# Patient Record
Sex: Female | Born: 1980 | Race: White | Hispanic: No | Marital: Single | Smoking: Never smoker
Health system: Southern US, Community
[De-identification: ages and names within clinical notes are randomized; demographics above are authoritative.]

## PROBLEM LIST (undated history)

## (undated) HISTORY — PX: NO PAST SURGERIES: SHX2092

---

## 2012-10-10 ENCOUNTER — Ambulatory Visit: Payer: Self-pay | Admitting: Family Medicine

## 2014-03-19 ENCOUNTER — Ambulatory Visit: Payer: Self-pay | Admitting: Medical

## 2014-03-19 LAB — RAPID STREP-A WITH REFLX: MICRO TEXT REPORT: NEGATIVE

## 2014-03-22 LAB — BETA STREP CULTURE(ARMC)

## 2016-07-18 ENCOUNTER — Encounter: Payer: Self-pay | Admitting: *Deleted

## 2016-07-18 ENCOUNTER — Ambulatory Visit
Admission: EM | Admit: 2016-07-18 | Discharge: 2016-07-18 | Disposition: A | Discharge status: Home / Self Care | Payer: Self-pay | Attending: Family Medicine | Admitting: Family Medicine

## 2016-07-18 DIAGNOSIS — J029 Acute pharyngitis, unspecified: Secondary | ICD-10-CM

## 2016-07-18 LAB — RAPID STREP SCREEN (MED CTR MEBANE ONLY): Streptococcus, Group A Screen (Direct): NEGATIVE

## 2016-07-18 MED ORDER — AMOXICILLIN 875 MG PO TABS: 875.0000 mg | ORAL_TABLET | Freq: Two times a day (BID) | ORAL | 0 refills | Status: DC

## 2016-07-18 MED ORDER — OSELTAMIVIR PHOSPHATE 75 MG PO CAPS: 75.0000 mg | ORAL_CAPSULE | Freq: Two times a day (BID) | ORAL | 0 refills | Status: DC

## 2016-07-18 NOTE — ED Triage Notes (Signed)
Sore throat, fever, chills, body aches, since yesterday.

## 2016-07-18 NOTE — ED Provider Notes (Signed)
[facility] ____________________________________________  Time seen: Approximately 5:43 PM  I have reviewed the triage vital signs and the nursing notes.   HISTORY  Chief Complaint Sore Throat; Fever; Chills; and Generalized Body Aches   HPI Denise Thompson is a 36 y.o. female  pain for the complaints of sore throat, chills, body aches and fever that started this morning and gradually worsened as the day progressed. States moderate sore throat, stating that it hurts to swallow but able to. Reports low-grade fever this afternoon. Reports multiple sick contacts at work around her. Unsure if strep or influenza contacts. Reports has continued to drink fluids today, decreased appetite. Reports has continued to remain active. Denies cough, nasal congestion or runny nose. Denies other complaints.  Denies chest pain, shortness of breath, abdominal pain, dysuria, extremity pain, extremity swelling or rash. Denies recent sickness. Denies recent antibiotic use.   Patient's last menstrual period was 07/04/2016 (approximate). Denies pregnancy.   History reviewed. No pertinent past medical history.  denies chronic medical issues.  There are no active problems to display for this patient.   History reviewed. No pertinent surgical history.   No current facility-administered medications for this encounter.   Current Outpatient Prescriptions:  .  amoxicillin (AMOXIL) 875 MG tablet, Take 1 tablet (875 mg total) by mouth 2 (two) times daily., Disp: 20 tablet, Rfl: 0 .  oseltamivir (TAMIFLU) 75 MG capsule, Take 1 capsule (75 mg total) by mouth every 12 (twelve) hours., Disp: 10 capsule, Rfl: 0  Allergies Codeine  History reviewed. No pertinent family history.  Social History Social History  Substance Use Topics  . Smoking status: Never Smoker  . Smokeless tobacco: Never Used  . Alcohol use Yes    Review of Systems Constitutional: As above.  Eyes: No visual changes. ENT:   positive sore throat. Cardiovascular: Denies chest pain. Respiratory: Denies shortness of breath. Gastrointestinal: No abdominal pain.  No nausea, no vomiting.  No diarrhea.  No constipation. Genitourinary: Negative for dysuria. Musculoskeletal: Negative for back pain. Skin: Negative for rash. Neurological: Negative for headaches, focal weakness or numbness.  10-point ROS otherwise negative.  ____________________________________________   PHYSICAL EXAM:  VITAL SIGNS: ED Triage Vitals  Enc Vitals Group     BP 07/18/16 1642 135/82     Pulse Rate 07/18/16 1642 92     Resp 07/18/16 1642 16     Temp 07/18/16 1642 99.2 F (37.3 C)     Temp Source 07/18/16 1642 Oral     SpO2 07/18/16 1642 100 %     Weight --      Height --      Head Circumference --      Peak Flow --      Pain Score 07/18/16 1717 0     Pain Loc --      Pain Edu? --      Excl. in [place]? --     Constitutional: Alert and oriented. Well appearing and in no acute distress. Eyes: Conjunctivae are normal. PERRL. EOMI. Head: Atraumatic. No sinus tenderness to palpation. No swelling. No erythema.  Ears: no erythema, normal TMs bilaterally.   Nose: No nasal congestion or rhinorrhea.  Mouth/Throat: Mucous membranes are moist. Moderate pharyngeal erythema. Mild bilateral tonsillar swelling. No exudate. Neck: No stridor.  No cervical spine tenderness to palpation. Hematological/Lymphatic/Immunilogical: Mild anterior bilateral cervical lymphadenopathy. Cardiovascular: Normal rate, regular rhythm. Grossly normal heart sounds.  Good peripheral circulation. Respiratory: Normal respiratory effort.  No retractions. No wheezes, rales or rhonchi.  Good air movement.  Gastrointestinal: Soft and nontender. No CVA tenderness. Musculoskeletal: Ambulatory with steady gait. No cervical, thoracic or lumbar tenderness to palpation. Neurologic:  Normal speech and language. No gait instability. Skin:  Skin appears warm, dry and intact. No  rash noted. Psychiatric: Mood and affect are normal. Speech and behavior are normal. ___________________________________________   LABS (all labs ordered are listed, but only abnormal results are displayed)  Labs Reviewed  RAPID STREP SCREEN (NOT AT [facility])  CULTURE, GROUP A STREP [facility])   PROCEDURES Procedures    INITIAL IMPRESSION / ASSESSMENT AND PLAN / ED COURSE  Pertinent labs & imaging results that were available during my care of the patient were reviewed by me and considered in my medical decision making (see chart for details).   well-appearing patient. No acute distress. Presents for the complaints of sore throat, chills, body aches and reported low-grade fevers. Denies cough or congestion. Discussed with patient concern for streptococcal pharyngitis. Quick strep negative, will culture. Patient expressed also concern of influenza. Discussed evaluation and treatment options with patient. As concern for streptococcal pharyngitis, will initiate oral amoxicillin., And will culture strep swab. Patient expressed concern of influenza if she were to go home and start with cough and congestion, Rx for Tamiflu sent and discussed timeframe parameters, patient states will only take if she begins to have cough and congestion in the next day. Discussed indication, risks and benefits of medications with patient.work note given for today and tomorrow.    Discussed follow up with Primary care physician this week. Discussed follow up and return parameters including no resolution or any worsening concerns. Patient verbalized understanding and agreed to plan.    ____________________________________________   FINAL CLINICAL IMPRESSION(S) / ED DIAGNOSES  Final diagnoses:  Pharyngitis, unspecified etiology     Discharge Medication List as of 07/18/2016  5:13 PM    START taking these medications   Details  amoxicillin (AMOXIL) 875 MG tablet Take 1 tablet (875 mg total) by mouth 2 (two) times  daily., Starting Wed 07/18/2016, Normal    oseltamivir (TAMIFLU) 75 MG capsule Take 1 capsule (75 mg total) by mouth every 12 (twelve) hours., Starting Wed 07/18/2016, Normal        Note: This dictation was prepared with Dragon dictation along with smaller phrase technology. Any transcriptional errors that result from this process are unintentional.         Marylene Land, NP 07/18/16 1757

## 2016-07-18 NOTE — Discharge Instructions (Signed)
Take medication as prescribed. Rest. Drink plenty of fluids.  ° °Follow up with your primary care physician this week as needed. Return to Urgent care for new or worsening concerns.  ° °

## 2016-07-21 LAB — CULTURE, GROUP A STREP (THRC)

## 2018-08-13 ENCOUNTER — Ambulatory Visit
Admission: EM | Admit: 2018-08-13 | Discharge: 2018-08-13 | Disposition: A | Discharge status: Home / Self Care | Payer: Self-pay | Attending: Family Medicine | Admitting: Family Medicine

## 2018-08-13 ENCOUNTER — Encounter: Payer: Self-pay | Admitting: Emergency Medicine

## 2018-08-13 ENCOUNTER — Other Ambulatory Visit: Payer: Self-pay

## 2018-08-13 DIAGNOSIS — R079 Chest pain, unspecified: Secondary | ICD-10-CM

## 2018-08-13 DIAGNOSIS — J302 Other seasonal allergic rhinitis: Secondary | ICD-10-CM

## 2018-08-13 DIAGNOSIS — R0789 Other chest pain: Secondary | ICD-10-CM

## 2018-08-13 DIAGNOSIS — R0602 Shortness of breath: Secondary | ICD-10-CM

## 2018-08-13 NOTE — ED Provider Notes (Signed)
[facility]    CSN: [id number] Arrival date & time: 08/13/18  1452     History   Chief Complaint Chief Complaint  Patient presents with  . Chest Pain  . Shortness of Breath    HPI Denise Thompson is a 38 y.o. female.   38 yo female with a c/o left sided chest pain and shortness of breath for 2 days. States chest pain is worse with movement and taking deep breaths. States she's slightly anxious. She also has seasonal allergies. Denies pain radiating, neck pain, jaw pain, left arm pain, injuries, fevers, chills, cough, travel. States she's slightly anxious due to "everything going around with the coronavirus".   The history is provided by the patient.  Chest Pain  Pain location:  L chest Pain quality: aching   Pain severity:  Mild Timing:  Constant Associated symptoms: shortness of breath   Shortness of Breath  Associated symptoms: chest pain     History reviewed. No pertinent past medical history.  There are no active problems to display for this patient.   Past Surgical History:  Procedure Laterality Date  . NO PAST SURGERIES      OB History   No obstetric history on file.      Home Medications    Prior to Admission medications   Medication Sig Start Date End Date Taking? Authorizing Provider  amoxicillin (AMOXIL) 875 MG tablet Take 1 tablet (875 mg total) by mouth 2 (two) times daily. 07/18/16   Marylene Land, NP  oseltamivir (TAMIFLU) 75 MG capsule Take 1 capsule (75 mg total) by mouth every 12 (twelve) hours. 07/18/16   Marylene Land, NP    Family History Family History  Problem Relation Age of Onset  . Hypertension Mother   . Healthy Father     Social History Social History   Tobacco Use  . Smoking status: Never Smoker  . Smokeless tobacco: Never Used  Substance Use Topics  . Alcohol use: Never    Frequency: Never  . Drug use: Never     Allergies   Codeine   Review of Systems Review of Systems  Respiratory:  Positive for shortness of breath.   Cardiovascular: Positive for chest pain.     Physical Exam Triage Vital Signs ED Triage Vitals  Enc Vitals Group     BP 08/13/18 1507 132/72     Pulse Rate 08/13/18 1507 91     Resp 08/13/18 1507 16     Temp 08/13/18 1507 98.4 F (36.9 C)     Temp Source 08/13/18 1507 Oral     SpO2 08/13/18 1507 100 %     Weight 08/13/18 1508 160 lb (72.6 kg)     Height 08/13/18 1508 6' (1.829 m)     Head Circumference --      Peak Flow --      Pain Score 08/13/18 1507 4     Pain Loc --      Pain Edu? --      Excl. in [place]? --    No data found.  Updated Vital Signs BP 132/72 (BP Location: Right Arm)   Pulse 91   Temp 98.4 F (36.9 C) (Oral)   Resp 16   Ht 6' (1.829 m)   Wt 72.6 kg   LMP 08/06/2018 (Approximate)   SpO2 100%   BMI 21.70 kg/m   Visual Acuity Right Eye Distance:   Left Eye Distance:   Bilateral Distance:    Right Eye Near:  Left Eye Near:    Bilateral Near:     Physical Exam Vitals signs and nursing note reviewed.  Constitutional:      General: She is not in acute distress.    Appearance: She is not toxic-appearing or diaphoretic.  Cardiovascular:     Rate and Rhythm: Normal rate and regular rhythm.     Pulses: Normal pulses.     Heart sounds: Normal heart sounds.  Pulmonary:     Effort: Pulmonary effort is normal. No respiratory distress.     Breath sounds: Normal breath sounds. No stridor. No wheezing, rhonchi or rales.  Neurological:     Mental Status: She is alert.      UC Treatments / Results  Labs (all labs ordered are listed, but only abnormal results are displayed) Labs Reviewed - No data to display  EKG None  Radiology No results found.  Procedures ED EKG Date/Time: 08/13/2018 9:00 PM Performed by: Norval Gable, MD Authorized by: Coral Spikes, DO   ECG reviewed by ED Physician in the absence of a cardiologist: yes   Previous ECG:    Previous ECG:  Unavailable Interpretation:     Interpretation: normal   Rate:    ECG rate:  84   ECG rate assessment: normal   Rhythm:    Rhythm: sinus rhythm   Ectopy:    Ectopy: none   QRS:    QRS axis:  Normal Conduction:    Conduction: normal   ST segments:    ST segments:  Normal T waves:    T waves: normal     (including critical care time)  Medications Ordered in UC Medications - No data to display  Initial Impression / Assessment and Plan / UC Course  I have reviewed the triage vital signs and the nursing notes.  Pertinent labs & imaging results that were available during my care of the patient were reviewed by me and considered in my medical decision making (see chart for details).      Final Clinical Impressions(s) / UC Diagnoses   Final diagnoses:  Atypical chest pain  Seasonal allergies     Discharge Instructions     Over the counter zyrtec, benadryl and flonase    ED Prescriptions    None     1. diagnosis reviewed with patient 2. Recommend supportive treatment as above    3. Follow-up prn if symptoms worsen or don't improve   Controlled Substance Prescriptions [place] Controlled Substance Registry consulted? Not Applicable   Norval Gable, MD 08/13/18 2106

## 2018-08-13 NOTE — Discharge Instructions (Addendum)
Over the counter zyrtec, benadryl and flonase

## 2018-08-13 NOTE — ED Triage Notes (Addendum)
Patient in today c/o CP and SOB x 2 days. Patient describes CP as pressure. Patient denies nausea, emesis, jaw pain, shoulder pain or back pain. Patient denies cardiac history.

## 2019-06-11 ENCOUNTER — Ambulatory Visit
Admission: EM | Admit: 2019-06-11 | Discharge: 2019-06-11 | Disposition: A | Discharge status: Home / Self Care | Payer: Self-pay | Attending: Family Medicine | Admitting: Family Medicine

## 2019-06-11 ENCOUNTER — Other Ambulatory Visit: Payer: Self-pay

## 2019-06-11 DIAGNOSIS — F411 Generalized anxiety disorder: Secondary | ICD-10-CM

## 2019-06-11 NOTE — Discharge Instructions (Signed)
Rest.  No need to worry.  See attached info regarding managing anxiety.  Take care  Dr. Lacinda Axon

## 2019-06-11 NOTE — ED Provider Notes (Signed)
[facility]    CSN: [id number] Arrival date & time: 06/11/19  [id number]   History   Chief Complaint Chief Complaint  Patient presents with  . Shortness of Breath    HPI 39 year old female presents with SOB.  Patient reports that over the past 4 days she has felt short of breath.  She has difficulty describing what she means when she reports shortness of breath.  However, she states that she has periods of time where she seems to breathe rapidly.  She was recently tested for Covid and her result was negative on 1/6.  Patient has no cough.  No respiratory symptoms.  No fever.  She has no other complaints or associated symptoms.  No known exacerbating or relieving factors.  No other complaints.  PMH, Surgical Hx, Family Hx, Social History reviewed and updated as below.  PMH: No significant PMH.  Past Surgical History:  Procedure Laterality Date  . NO PAST SURGERIES     OB History   No obstetric history on file.     Home Medications    Prior to Admission medications   Not on File    Family History Family History  Problem Relation Age of Onset  . Hypertension Mother   . Healthy Father     Social History Social History   Tobacco Use  . Smoking status: Never Smoker  . Smokeless tobacco: Never Used  Substance Use Topics  . Alcohol use: Never  . Drug use: Never     Allergies   Codeine   Review of Systems Review of Systems  Constitutional: Negative.   HENT: Negative.   Respiratory: Positive for shortness of breath.    Physical Exam Triage Vital Signs ED Triage Vitals  Enc Vitals Group     BP 06/11/19 0943 (!) 143/68     Pulse Rate 06/11/19 0943 97     Resp 06/11/19 0943 18     Temp 06/11/19 0943 99.1 F (37.3 C)     Temp Source 06/11/19 0943 Oral     SpO2 06/11/19 0943 100 %     Weight 06/11/19 0940 150 lb (68 kg)     Height 06/11/19 0940 6' (1.829 m)     Head Circumference --      Peak Flow --      Pain Score 06/11/19 0940 5     Pain  Loc --      Pain Edu? --      Excl. in [place]? --    Updated Vital Signs BP (!) 143/68 (BP Location: Left Arm)   Pulse 97   Temp 99.1 F (37.3 C) (Oral)   Resp 18   Ht 6' (1.829 m)   Wt 68 kg   LMP 06/04/2019   SpO2 100%   BMI 20.34 kg/m   Visual Acuity Right Eye Distance:   Left Eye Distance:   Bilateral Distance:    Right Eye Near:   Left Eye Near:    Bilateral Near:     Physical Exam Vitals and nursing note reviewed.  Constitutional:      General: She is not in acute distress.    Appearance: Normal appearance. She is not ill-appearing.  HENT:     Head: Normocephalic and atraumatic.  Eyes:     General:        Right eye: No discharge.        Left eye: No discharge.     Conjunctiva/sclera: Conjunctivae normal.  Cardiovascular:  Rate and Rhythm: Normal rate and regular rhythm.     Heart sounds: No murmur.  Pulmonary:     Effort: Pulmonary effort is normal.     Breath sounds: Normal breath sounds. No wheezing, rhonchi or rales.  Neurological:     Mental Status: She is alert.     Comments: Stutter noted.  Psychiatric:     Comments: Flat affect. Depressed mood.     UC Treatments / Results  Labs (all labs ordered are listed, but only abnormal results are displayed) Labs Reviewed - No data to display  EKG   Radiology No results found.  Procedures Procedures (including critical care time)  Medications Ordered in UC Medications - No data to display  Initial Impression / Assessment and Plan / UC Course  I have reviewed the triage vital signs and the nursing notes.  Pertinent labs & imaging results that were available during my care of the patient were reviewed by me and considered in my medical decision making (see chart for details).    39 year old female presents with reported shortness of breath.  She is well-appearing.  She is not tachypneic or hypoxic.  Lungs clear.  Patient seems anxious.  I suspect that this is all secondary to anxiety.   Information given.  Supportive care.  Final Clinical Impressions(s) / UC Diagnoses   Final diagnoses:  Anxiety state     Discharge Instructions     Rest.  No need to worry.  See attached info regarding managing anxiety.  Take care  Dr. Lacinda Axon    ED Prescriptions    None     PDMP not reviewed this encounter.   Coral Spikes, [place] 06/11/19 1036

## 2019-06-11 NOTE — ED Triage Notes (Signed)
Patient states that she is here for shortness of breath that started 4 days ago. States that she was swabbed for Covid on 06/10/2018 and was negative, feels like she cannot take a deep breath.

## 2019-06-30 ENCOUNTER — Other Ambulatory Visit: Payer: Self-pay

## 2019-06-30 ENCOUNTER — Encounter: Payer: Self-pay | Admitting: Emergency Medicine

## 2019-06-30 ENCOUNTER — Ambulatory Visit
Admission: EM | Admit: 2019-06-30 | Discharge: 2019-06-30 | Disposition: A | Discharge status: Home / Self Care | Payer: Self-pay | Attending: Family Medicine | Admitting: Family Medicine

## 2019-06-30 DIAGNOSIS — R631 Polydipsia: Secondary | ICD-10-CM

## 2019-06-30 DIAGNOSIS — R5383 Other fatigue: Secondary | ICD-10-CM

## 2019-06-30 DIAGNOSIS — R3589 Other polyuria: Secondary | ICD-10-CM

## 2019-06-30 DIAGNOSIS — R519 Headache, unspecified: Secondary | ICD-10-CM

## 2019-06-30 DIAGNOSIS — R531 Weakness: Secondary | ICD-10-CM

## 2019-06-30 DIAGNOSIS — E232 Diabetes insipidus: Secondary | ICD-10-CM

## 2019-06-30 DIAGNOSIS — R350 Frequency of micturition: Secondary | ICD-10-CM

## 2019-06-30 DIAGNOSIS — R002 Palpitations: Secondary | ICD-10-CM

## 2019-06-30 LAB — POCT URINALYSIS DIP (MANUAL ENTRY)
Bilirubin, UA: NEGATIVE
Glucose, UA: NEGATIVE mg/dL
Ketones, POC UA: NEGATIVE mg/dL
Leukocytes, UA: NEGATIVE
Nitrite, UA: NEGATIVE
Protein Ur, POC: 30 mg/dL — AB
Spec Grav, UA: 1.015 (ref 1.010–1.025)
Urobilinogen, UA: 0.2 E.U./dL
pH, UA: 7 (ref 5.0–8.0)

## 2019-06-30 LAB — POCT FASTING CBG KUC MANUAL ENTRY: POCT Glucose (KUC): 114 mg/dL — AB (ref 70–99)

## 2019-06-30 NOTE — ED Provider Notes (Signed)
[facility]    CSN: [id number] Arrival date & time: 06/30/19  1524      History   Chief Complaint Chief Complaint  Patient presents with  . Headache  . Weakness    HPI Denise Thompson is a 39 y.o. female.   Reports that she has been experiencing intermittent symptoms of shakiness, headaches, shortness of breath, heart palpitations, increased thirst, urinary frequency since 06/10/19. Denies cough, fever, chills, body aches, n/v/d, rash. Reports that she has previously been seen for these symptoms and was told she possibly has anxiety. Reports that she does not feel anxious, or tense, or upset. Reports that she has not had labs done in years, and that she has not had thyroid checked or been checked for diabetes anywhere.   The history is provided by the patient.    History reviewed. No pertinent past medical history.  There are no problems to display for this patient.   Past Surgical History:  Procedure Laterality Date  . NO PAST SURGERIES      OB History   No obstetric history on file.      Home Medications    Prior to Admission medications   Not on File    Family History Family History  Problem Relation Age of Onset  . Hypertension Mother   . Healthy Father     Social History Social History   Tobacco Use  . Smoking status: Never Smoker  . Smokeless tobacco: Never Used  Substance Use Topics  . Alcohol use: Never  . Drug use: Never     Allergies   Codeine   Review of Systems Review of Systems  Constitutional: Positive for fatigue. Negative for chills and fever.  HENT: Negative for ear pain, postnasal drip, rhinorrhea, sinus pressure, sinus pain and sore throat.   Eyes: Negative for pain and visual disturbance.  Respiratory: Negative for cough and shortness of breath.   Cardiovascular: Negative for chest pain and palpitations.  Gastrointestinal: Negative for abdominal pain, diarrhea, nausea and vomiting.  Endocrine: Positive for  polydipsia and polyuria.  Genitourinary: Positive for frequency. Negative for dysuria and hematuria.  Musculoskeletal: Negative for arthralgias, back pain and myalgias.  Skin: Negative for color change and rash.  Neurological: Positive for headaches. Negative for seizures and syncope.  All other systems reviewed and are negative.    Physical Exam Triage Vital Signs ED Triage Vitals  Enc Vitals Group     BP      Pulse      Resp      Temp      Temp src      SpO2      Weight      Height      Head Circumference      Peak Flow      Pain Score      Pain Loc      Pain Edu?      Excl. in [place]?    No data found.  Updated Vital Signs BP 117/80 (BP Location: Left Arm)   Pulse 82   Temp 98.9 F (37.2 C) (Oral)   Resp 16   Ht 6' (1.829 m)   Wt 150 lb (68 kg)   LMP 06/27/2019 (Exact Date)   SpO2 98%   BMI 20.34 kg/m     Physical Exam Vitals and nursing note reviewed.  Constitutional:      General: She is not in acute distress.    Appearance: She is well-developed.  HENT:     Head: Normocephalic and atraumatic.     Right Ear: Tympanic membrane normal.     Left Ear: Tympanic membrane normal.     Mouth/Throat:     Mouth: Mucous membranes are moist.     Pharynx: No oropharyngeal exudate or posterior oropharyngeal erythema.  Eyes:     Conjunctiva/sclera: Conjunctivae normal.  Cardiovascular:     Rate and Rhythm: Normal rate. Rhythm irregular.     Heart sounds: No murmur.  Pulmonary:     Effort: Pulmonary effort is normal. No respiratory distress.     Breath sounds: Normal breath sounds.  Abdominal:     General: Abdomen is flat.     Palpations: Abdomen is soft.     Tenderness: There is no abdominal tenderness.  Musculoskeletal:        General: Normal range of motion.     Cervical back: Neck supple.  Skin:    General: Skin is warm and dry.  Neurological:     General: No focal deficit present.     Mental Status: She is alert and oriented to person, place, and time.   Psychiatric:        Mood and Affect: Mood normal.        Behavior: Behavior normal.      UC Treatments / Results  Labs (all labs ordered are listed, but only abnormal results are displayed) Labs Reviewed  POCT FASTING CBG [facility] MANUAL ENTRY - Abnormal; Notable for the following components:      Result Value   POCT Glucose ([facility]) 114 (*)    All other components within normal limits  POCT URINALYSIS DIP (MANUAL ENTRY) - Abnormal; Notable for the following components:   Clarity, UA hazy (*)    Blood, UA large (*)    Protein Ur, POC =30 (*)    All other components within normal limits    EKG   Radiology No results found.  Procedures Procedures (including critical care time)  Medications Ordered in UC Medications - No data to display  Initial Impression / Assessment and Plan / UC Course  I have reviewed the triage vital signs and the nursing notes.  Pertinent labs & imaging results that were available during my care of the patient were reviewed by me and considered in my medical decision making (see chart for details).  Clinical Course as of Jun 30 1615  Tue Jun 30, 2019  1603 POCT urinalysis dipstick [SM]    Clinical Course User Index [SM] Faustino Congress, NP   Presents today with ongoing intermittent symptoms of headache, shakiness, polydipsia, polyuria, heart palpitations. EKG in office today, HR 76, no ST elevation noted, compared to EKG from 08/2018, no marked changes. Instructed that she needs to find primary care provider and needs a physical with labs. Instructed on when to report to the Emergency Room.  Final Clinical Impressions(s) / UC Diagnoses   Final diagnoses:  Fatigue, unspecified type  Primary polydipsia  Frequency of urination and polyuria  Weakness  Palpitations     Discharge Instructions     You really need to establish a primary care provider. I have attached information for a provider near you.   Report to the emergency room if you have  shortness of breath, severe palpitations, lose consciousness, or other serious concerns.     ED Prescriptions    None     PDMP not reviewed this encounter.   Faustino Congress, NP 06/30/19 [phone]

## 2019-06-30 NOTE — ED Triage Notes (Signed)
Patient in today c/o headache, weakness, sob since 06/10/19. Patient was seen at [facility] in [facility] for same on 06/11/19.

## 2019-06-30 NOTE — Discharge Instructions (Signed)
You really need to establish a primary care provider. I have attached information for a provider near you.   Report to the emergency room if you have shortness of breath, severe palpitations, lose consciousness, or other serious concerns.

## 2019-07-03 ENCOUNTER — Emergency Department
Admission: EM | Admit: 2019-07-03 | Discharge: 2019-07-03 | Disposition: A | Discharge status: Home / Self Care | Payer: Self-pay | Attending: Emergency Medicine | Admitting: Emergency Medicine

## 2019-07-03 ENCOUNTER — Other Ambulatory Visit: Payer: Self-pay

## 2019-07-03 ENCOUNTER — Encounter: Payer: Self-pay | Admitting: Emergency Medicine

## 2019-07-03 ENCOUNTER — Emergency Department: Payer: Self-pay

## 2019-07-03 DIAGNOSIS — G44209 Tension-type headache, unspecified, not intractable: Secondary | ICD-10-CM | POA: Insufficient documentation

## 2019-07-03 DIAGNOSIS — Z885 Allergy status to narcotic agent status: Secondary | ICD-10-CM | POA: Insufficient documentation

## 2019-07-03 LAB — COMPREHENSIVE METABOLIC PANEL WITH GFR
ALT: 14 U/L (ref 0–44)
AST: 19 U/L (ref 15–41)
Albumin: 4.1 g/dL (ref 3.5–5.0)
Alkaline Phosphatase: 26 U/L — ABNORMAL LOW (ref 38–126)
Anion gap: 8 (ref 5–15)
BUN: 12 mg/dL (ref 6–20)
CO2: 28 mmol/L (ref 22–32)
Calcium: 9.1 mg/dL (ref 8.9–10.3)
Chloride: 103 mmol/L (ref 98–111)
Creatinine, Ser: 0.81 mg/dL (ref 0.44–1.00)
GFR calc Af Amer: >60 mL/min
GFR calc non Af Amer: >60 mL/min
Glucose, Bld: 92 mg/dL (ref 70–99)
Potassium: 4 mmol/L (ref 3.5–5.1)
Sodium: 139 mmol/L (ref 135–145)
Total Bilirubin: 0.7 mg/dL (ref 0.3–1.2)
Total Protein: 6.8 g/dL (ref 6.5–8.1)

## 2019-07-03 LAB — CBC WITH DIFFERENTIAL/PLATELET
Abs Immature Granulocytes: 0 K/uL (ref 0.00–0.07)
Basophils Absolute: 0 K/uL (ref 0.0–0.1)
Basophils Relative: 1 %
Eosinophils Absolute: 0.1 K/uL (ref 0.0–0.5)
Eosinophils Relative: 2 %
HCT: 34.2 % — ABNORMAL LOW (ref 36.0–46.0)
Hemoglobin: 11.3 g/dL — ABNORMAL LOW (ref 12.0–15.0)
Immature Granulocytes: 0 %
Lymphocytes Relative: 34 %
Lymphs Abs: 1.1 K/uL (ref 0.7–4.0)
MCH: 30.2 pg (ref 26.0–34.0)
MCHC: 33 g/dL (ref 30.0–36.0)
MCV: 91.4 fL (ref 80.0–100.0)
Monocytes Absolute: 0.2 K/uL (ref 0.1–1.0)
Monocytes Relative: 7 %
Neutro Abs: 1.8 K/uL (ref 1.7–7.7)
Neutrophils Relative %: 56 %
Platelets: 226 K/uL (ref 150–400)
RBC: 3.74 MIL/uL — ABNORMAL LOW (ref 3.87–5.11)
RDW: 12.4 % (ref 11.5–15.5)
WBC: 3.2 K/uL — ABNORMAL LOW (ref 4.0–10.5)
nRBC: 0 % (ref 0.0–0.2)

## 2019-07-03 LAB — TSH: TSH: 1.946 u[IU]/mL (ref 0.350–4.500)

## 2019-07-03 MED ORDER — PROMETHAZINE HCL 12.5 MG PO TABS: 12.5000 mg | ORAL_TABLET | Freq: Four times a day (QID) | ORAL | 0 refills | Status: DC | PRN

## 2019-07-03 MED ORDER — PROMETHAZINE HCL 25 MG/ML IJ SOLN
12.5000 mg | Freq: Once | INTRAMUSCULAR | Status: AC
  Administered 2019-07-03: 09:00:00 12.5 mg via INTRAVENOUS
  Filled 2019-07-03: qty 1

## 2019-07-03 MED ORDER — SODIUM CHLORIDE 0.9 % IV BOLUS
1000.0000 mL | Freq: Once | INTRAVENOUS | Status: AC
  Administered 2019-07-03: 1000 mL via INTRAVENOUS

## 2019-07-03 NOTE — Discharge Instructions (Signed)
Take tylenol, excedrin for headaches   See Dr. Melrose Nakayama for follow up   Take phenergan for severe headaches   Return to ER if you have worse headaches, trouble speaking

## 2019-07-03 NOTE — ED Provider Notes (Signed)
[facility] EMERGENCY DEPARTMENT Provider Note   CSN: [id number] Arrival date & time: 07/03/19  [id number]     History Chief Complaint  Patient presents with  . Dizziness  . Headache    Denise Thompson is a 39 y.o. female otherwise healthy here presenting with dizziness and headaches.  Patient states that she has been having intermittent headaches for the last 3 weeks.  She has been more anxious recently.  She went to see urgent care earlier in the month and had a negative Covid test.  She was thought to have anxiety.  She was seen at urgent care recently and had a blood sugar 115 and some glucose in her urine and was still thought to have anxiety as well.  Patient states that she stutters at baseline and her stuttering has gotten slightly worse.  She is weak all over.  Denies any falls or numbness or focal weakness.  Denies any history of aneurysms.  The history is provided by the patient.       History reviewed. No pertinent past medical history.  There are no problems to display for this patient.   Past Surgical History:  Procedure Laterality Date  . NO PAST SURGERIES       OB History   No obstetric history on file.     Family History  Problem Relation Age of Onset  . Hypertension Mother   . Healthy Father     Social History   Tobacco Use  . Smoking status: Never Smoker  . Smokeless tobacco: Never Used  Substance Use Topics  . Alcohol use: Never  . Drug use: Never    Home Medications Prior to Admission medications   Not on File    Allergies    Codeine  Review of Systems   Review of Systems  Neurological: Positive for dizziness and headaches.  All other systems reviewed and are negative.   Physical Exam Updated Vital Signs BP 120/79 (BP Location: Left Arm)   Pulse 81   Temp 97.8 F (36.6 C) (Oral)   Resp 18   Ht 6' (1.829 m)   Wt 68 kg   LMP 06/27/2019 (Exact Date)   SpO2 100%   BMI 20.33 kg/m   Physical Exam Vitals  and nursing note reviewed.  Constitutional:      Comments: Anxious   HENT:     Head: Normocephalic.     Mouth/Throat:     Mouth: Mucous membranes are moist.  Eyes:     Extraocular Movements: Extraocular movements intact.  Cardiovascular:     Rate and Rhythm: Normal rate and regular rhythm.     Heart sounds: Normal heart sounds.  Pulmonary:     Effort: Pulmonary effort is normal.     Breath sounds: Normal breath sounds.  Abdominal:     General: Bowel sounds are normal.     Palpations: Abdomen is soft.  Musculoskeletal:        General: Normal range of motion.     Cervical back: Normal range of motion and neck supple.  Skin:    General: Skin is warm.  Neurological:     Mental Status: She is alert and oriented to person, place, and time.     Comments: Some stuttering speech but no slurred speech. CN 2- 12 intact, nl strength throughout, nl sensation throughout. Nl gait   Psychiatric:        Mood and Affect: Mood normal.     ED Results / Procedures /  Treatments   Labs (all labs ordered are listed, but only abnormal results are displayed) Labs Reviewed  CBC WITH DIFFERENTIAL/PLATELET - Abnormal; Notable for the following components:      Result Value   WBC 3.2 (*)    RBC 3.74 (*)    Hemoglobin 11.3 (*)    HCT 34.2 (*)    All other components within normal limits  COMPREHENSIVE METABOLIC PANEL  URINALYSIS, COMPLETE (UACMP) WITH MICROSCOPIC  TSH  POC URINE PREG, ED    EKG None  Radiology CT Head Wo Contrast  Result Date: 07/03/2019 CLINICAL DATA:  Headache and dizziness for 3 weeks. EXAM: CT HEAD WITHOUT CONTRAST TECHNIQUE: Contiguous axial images were obtained from the base of the skull through the vertex without intravenous contrast. COMPARISON:  None. FINDINGS: Brain: No evidence of acute infarction, hemorrhage, hydrocephalus, extra-axial collection or mass lesion/mass effect. Vascular: No hyperdense vessel or unexpected calcification. Skull: Normal. Negative for  fracture or focal lesion. Sinuses/Orbits: Normal. Other: None. IMPRESSION: Normal head CT. Electronically Signed   By: Inge Rise M.D.   On: 07/03/2019 09:05    Procedures Procedures (including critical care time)  Medications Ordered in ED Medications  sodium chloride 0.9 % bolus 1,000 mL (1,000 mLs Intravenous New Bag/Given 07/03/19 0922)  promethazine (PHENERGAN) injection 12.5 mg (12.5 mg Intravenous Given 07/03/19 [id number])    ED Course  I have reviewed the triage vital signs and the nursing notes.  Pertinent labs & imaging results that were available during my care of the patient were reviewed by me and considered in my medical decision making (see chart for details).    MDM Rules/Calculators/A&P                     Denise Thompson is a 39 y.o. female here with headache and dizziness.  Symptoms for the last 3 weeks or so.  Patient appears anxious and I suspect some tension headaches or anxiety.  Given that the headache has persisted we will get a CT head to rule out bleed or mass.   11:32 AM Labs and CT head unremarkable. I think likely tension headaches. Will have her continue tylenol, excedrin, will give phenergan prn. Will have her see neurology outpatient   Final Clinical Impression(s) / ED Diagnoses Final diagnoses:  None    Rx / DC Orders ED Discharge Orders    None       Drenda Freeze, MD 07/03/19 1132

## 2019-07-03 NOTE — ED Triage Notes (Signed)
C/O headache and dizziness x 3 weeks.  Seen through urgent care this morning, and referred to ED for further evaluation.  Patient is AAOx3.  Skin warm and dry. NAD

## 2019-07-03 NOTE — ED Notes (Signed)
pt up to toilet

## 2019-07-03 NOTE — ED Notes (Signed)
Pt transported to CT ?

## 2019-08-07 ENCOUNTER — Ambulatory Visit: Payer: Self-pay

## 2019-08-08 ENCOUNTER — Other Ambulatory Visit: Payer: Self-pay

## 2019-08-08 ENCOUNTER — Ambulatory Visit: Payer: Self-pay | Attending: Internal Medicine

## 2019-08-08 DIAGNOSIS — Z23 Encounter for immunization: Secondary | ICD-10-CM

## 2019-08-08 NOTE — Progress Notes (Signed)
   Covid-19 Vaccination Clinic  Name:  Denise Thompson    MRN: [id number] DOB: Oct 08, 1980  08/08/2019  [name] was observed post Covid-19 immunization for 15 minutes without incident. She was provided with Vaccine Information Sheet and instruction to access the V-Safe system.   [name] was instructed to call 911 with any severe reactions post vaccine: Marland Kitchen Difficulty breathing  . Swelling of face and throat  . A fast heartbeat  . A bad rash all over body  . Dizziness and weakness   Immunizations Administered    Name Date Dose VIS Date Route   Moderna COVID-19 Vaccine 08/08/2019  3:49 PM 0.5 mL 05/05/2019 Intramuscular   Manufacturer: Moderna   Lot: [id number]   [id number]

## 2019-08-12 ENCOUNTER — Ambulatory Visit: Payer: Self-pay | Admitting: Family Medicine

## 2019-08-13 ENCOUNTER — Ambulatory Visit: Payer: Self-pay

## 2019-08-14 ENCOUNTER — Ambulatory Visit: Payer: Self-pay

## 2019-09-05 ENCOUNTER — Ambulatory Visit: Payer: Medicaid Other | Attending: Internal Medicine

## 2019-09-05 DIAGNOSIS — Z23 Encounter for immunization: Secondary | ICD-10-CM

## 2019-09-05 NOTE — Progress Notes (Signed)
   Covid-19 Vaccination Clinic  Name:  Minakshi Nordine    MRN: [id number] DOB: 1980-11-20  09/05/2019  Ms. Hocevar was observed post Covid-19 immunization for 15 minutes without incident. She was provided with Vaccine Information Sheet and instruction to access the V-Safe system.   Ms. Soles was instructed to call 911 with any severe reactions post vaccine: Marland Kitchen Difficulty breathing  . Swelling of face and throat  . A fast heartbeat  . A bad rash all over body  . Dizziness and weakness   Immunizations Administered    Name Date Dose VIS Date Route   Moderna COVID-19 Vaccine 09/05/2019  8:56 AM 0.5 mL 05/05/2019 Intramuscular   Manufacturer: Levan Hurst   [id number]   Edith [id number]

## 2019-10-28 ENCOUNTER — Emergency Department
Admission: EM | Admit: 2019-10-28 | Discharge: 2019-10-28 | Disposition: A | Discharge status: Home / Self Care | Payer: Medicaid Other | Attending: Emergency Medicine | Admitting: Emergency Medicine

## 2019-10-28 ENCOUNTER — Emergency Department: Payer: Medicaid Other

## 2019-10-28 ENCOUNTER — Other Ambulatory Visit: Payer: Self-pay

## 2019-10-28 DIAGNOSIS — Z5321 Procedure and treatment not carried out due to patient leaving prior to being seen by health care provider: Secondary | ICD-10-CM | POA: Insufficient documentation

## 2019-10-28 DIAGNOSIS — R42 Dizziness and giddiness: Secondary | ICD-10-CM | POA: Insufficient documentation

## 2019-10-28 DIAGNOSIS — R0789 Other chest pain: Secondary | ICD-10-CM | POA: Insufficient documentation

## 2019-10-28 LAB — BASIC METABOLIC PANEL
Anion gap: 9 (ref 5–15)
BUN: 17 mg/dL (ref 6–20)
CO2: 26 mmol/L (ref 22–32)
Calcium: 9.4 mg/dL (ref 8.9–10.3)
Chloride: 102 mmol/L (ref 98–111)
Creatinine, Ser: 0.69 mg/dL (ref 0.44–1.00)
GFR calc Af Amer: >60 mL/min (ref >60)
GFR calc non Af Amer: >60 mL/min (ref >60)
Glucose, Bld: 109 mg/dL — ABNORMAL HIGH (ref 70–99)
Potassium: 4 mmol/L (ref 3.5–5.1)
Sodium: 137 mmol/L (ref 135–145)

## 2019-10-28 LAB — CBC
HCT: 35.1 % — ABNORMAL LOW (ref 36.0–46.0)
Hemoglobin: 11.7 g/dL — ABNORMAL LOW (ref 12.0–15.0)
MCH: 31.3 pg (ref 26.0–34.0)
MCHC: 33.3 g/dL (ref 30.0–36.0)
MCV: 93.9 fL (ref 80.0–100.0)
Platelets: 243 10*3/uL (ref 150–400)
RBC: 3.74 MIL/uL — ABNORMAL LOW (ref 3.87–5.11)
RDW: 13.5 % (ref 11.5–15.5)
WBC: 5.2 10*3/uL (ref 4.0–10.5)
nRBC: 0 % (ref 0.0–0.2)

## 2019-10-28 LAB — TROPONIN I (HIGH SENSITIVITY)
Troponin I (High Sensitivity): <2 ng/L (ref <18)
Troponin I (High Sensitivity): <2 ng/L (ref <18)

## 2019-10-28 MED ORDER — SODIUM CHLORIDE 0.9% FLUSH: 3.0000 mL | Freq: Once | INTRAVENOUS | Status: DC

## 2019-10-28 NOTE — ED Triage Notes (Signed)
Pt comes via POV from work with c/o dizziness and CP. Pt states the CP has been going on over a month. Pt states today while at work she felt like she was going to pass out.  Pt states mid sternal chest pain.

## 2019-10-31 ENCOUNTER — Telehealth: Payer: Medicaid Other | Admitting: Nurse Practitioner

## 2019-10-31 DIAGNOSIS — R0602 Shortness of breath: Secondary | ICD-10-CM

## 2019-10-31 DIAGNOSIS — D649 Anemia, unspecified: Secondary | ICD-10-CM

## 2019-10-31 DIAGNOSIS — J4520 Mild intermittent asthma, uncomplicated: Secondary | ICD-10-CM

## 2019-10-31 DIAGNOSIS — R0789 Other chest pain: Secondary | ICD-10-CM

## 2019-10-31 MED ORDER — ALBUTEROL SULFATE HFA 108 (90 BASE) MCG/ACT IN AERS: 2.0000 | INHALATION_SPRAY | Freq: Four times a day (QID) | RESPIRATORY_TRACT | 0 refills | Status: DC | PRN

## 2019-10-31 NOTE — Progress Notes (Signed)
I just responded to thi previously. Yes, your hemoglobin I low which mean anemia. It I common for thei to happen during or after your menses. You can take a multi vitamin with iron and that should help.  Patient keeps ending message wanting me to respond to lab work and EKG that was done in the ED yesterday.

## 2019-10-31 NOTE — Progress Notes (Signed)
E-Visit for Corona Virus Screening  Your current symptoms could be consistent with the coronavirus.  Many health care providers can now test patients at their office but not all are.  [place] has multiple testing sites. For information on our [place] testing locations and hours go to [email or web address]  We are enrolling you in our [place] for [place] . Daily you will receive a questionnaire within the [place] website. Our COVID 19 response team will be monitoring your responses daily.  Testing Information: The COVID-19 Community Testing sites will begin testing BY APPOINTMENT ONLY.  You can schedule online at [email or web address]  If you do not have access to a smart phone or computer you may call [phone] for an appointment.   Additional testing sites in the Community:  . For [facility] Testing sites in [facility]  FaceUpdate.uy  . For Pop-up testing sites in [place]  BowlDirectory.co.uk  . For Testing sites with regular hours [email or web address]  . For [place] [email or web address]  . For [facility] [email or web address]  . For [facility] testing in [place] and [employer] [email or web address]  . For [facility] testing in [facility]   [email or web address].care/[email or web address]  For  more information about community testing call [phone]   Please quarantine yourself while awaiting your test results. Please stay home for a minimum of 10 days from the first day of illness with improving symptoms and you have had 24 hours of no fever (without the use of Tylenol (Acetaminophen)  Motrin (Ibuprofen) or any fever reducing medication).  Also - Do not get tested prior to returning to work because once you have had a positive test the test can stay positive for more then a month in some cases.   You should wear a mask or cloth face covering over your nose and mouth if you must be around other people or animals, including pets (even at home). Try to stay at least 6 feet away from other people. This will protect the people around you.  Please continue good preventive care measures, including:  frequent hand-washing, avoid touching your face, cover coughs/sneezes, stay out of crowds and keep a 6 foot distance from others.  COVID-19 is a respiratory illness with symptoms that are similar to the flu. Symptoms are typically mild to moderate, but there have been cases of severe illness and death due to the virus.   The following symptoms may appear 2-14 days after exposure: . Fever . Cough . Shortness of breath or difficulty breathing . Chills . Repeated shaking with chills . Muscle pain . Headache . Sore throat . New loss of taste or smell . Fatigue . Congestion or runny nose . Nausea or vomiting . Diarrhea  Go to the nearest hospital ED for assessment if fever/cough/breathlessness are severe or illness seems like a threat to life.  It is vitally important that if you feel that you have an infection such as this virus or any other virus that you stay home and away from places where you may spread it to others.  You should avoid contact with people age 57 and older.   You can use medication such as A prescription inhaler called Albuterol MDI 90 mcg /actuation 2 puffs every 4 hours as needed for shortness of breath, wheezing, cough  You may also take acetaminophen (Tylenol) as needed for fever.  Reduce your risk of any infection by using the same precautions used for avoiding the common cold or flu:  Marland Kitchen Wash your  hands often with soap and warm water for at least 20 seconds.  If  soap and water are not readily available, use an alcohol-based hand sanitizer with at least 60% alcohol.  . If coughing or sneezing, cover your mouth and nose by coughing or sneezing into the elbow areas of your shirt or coat, into a tissue or into your sleeve (not your hands). . Avoid shaking hands with others and consider head nods or verbal greetings only. . Avoid touching your eyes, nose, or mouth with unwashed hands.  . Avoid close contact with people who are sick. . Avoid places or events with large numbers of people in one location, like concerts or sporting events. . Carefully consider travel plans you have or are making. . If you are planning any travel outside or inside the [place], visit the CDC's Travelers' Health webpage for the latest health notices. . If you have some symptoms but not all symptoms, continue to monitor at home and seek medical attention if your symptoms worsen. . If you are having a medical emergency, call 911.  HOME CARE . Only take medications as instructed by your medical team. . Drink plenty of fluids and get plenty of rest. . A steam or ultrasonic humidifier can help if you have congestion.   GET HELP RIGHT AWAY IF YOU HAVE EMERGENCY WARNING SIGNS** FOR COVID-19. If you or someone is showing any of these signs seek emergency medical care immediately. Call 911 or proceed to your closest emergency facility if: . You develop worsening high fever. . Trouble breathing . Bluish lips or face . Persistent pain or pressure in the chest . New confusion . Inability to wake or stay awake . You cough up blood. . Your symptoms become more severe  **This list is not all possible symptoms. Contact your medical provider for any symptoms that are sever or concerning to you.  MAKE SURE YOU   Understand these instructions.  Will watch your condition.  Will get help right away if you are not doing well or get worse.  Your e-visit answers were reviewed by a board  certified advanced clinical practitioner to complete your personal care plan.  Depending on the condition, your plan could have included both over the counter or prescription medications.  If there is a problem please reply once you have received a response from your provider.  Your safety is important to [place].  If you have drug allergies check your prescription carefully.    You can use MyChart to ask questions about today's visit, request a non-urgent call back, or ask for a work or school excuse for 24 hours related to this e-Visit. If it has been greater than 24 hours you will need to follow up with your provider, or enter a new e-Visit to address those concerns. You will get an e-mail in the next two days asking about your experience.  I hope that your e-visit has been valuable and will speed your recovery. Thank you for using e-visits.   5-10 minutes spent reviewing and documenting in chart.

## 2019-11-20 ENCOUNTER — Ambulatory Visit
Admission: EM | Admit: 2019-11-20 | Discharge: 2019-11-20 | Disposition: A | Discharge status: Home / Self Care | Payer: Medicaid Other

## 2019-11-20 ENCOUNTER — Encounter: Payer: Self-pay | Admitting: Emergency Medicine

## 2019-11-20 ENCOUNTER — Other Ambulatory Visit: Payer: Self-pay

## 2019-11-20 DIAGNOSIS — M79602 Pain in left arm: Secondary | ICD-10-CM

## 2019-11-20 NOTE — ED Triage Notes (Signed)
Patient c/o left sided neck pain and left armpit pain that started a week ago.  Patient states her armpit and neck is tender to the touch and when she moves her neck to the side.  Patient denies any fall or injury.  Patient reports some SOB off and on.  Patient denies chest pain.

## 2019-11-20 NOTE — ED Provider Notes (Addendum)
[facility]    CSN: [id number] Arrival date & time: 11/20/19  1032      History   Chief Complaint Chief Complaint  Patient presents with  . Neck Pain    left  . Arm Pain    HPI Denise Thompson is a 39 y.o. female.   Patient is a 39 year old female who presents with chief complaint of left armpit pain.  Patient had the pain for about a week ago.  Patient denies any fall, injury, or other trauma.  Patient denies any recent immunizations or shots.  Patient denies any chest pain.  She does report some occasional shortness of breath.  Patient reports that it has a constant aching but is more painful to the touch.  She is taking any over-the-counter medications or done anything at home for this pain.  Patient reports she does get breast tenderness during her menstrual cycle but that is typically just to the breast tissue itself and not to the armpit area on the arm.  Patient not noticed any nodules to the left breast during any recent self exams.   Patient does have history of multiple visits to the ER/urgent care over the last several months.  Concern for some anxiety with some related tension type headaches.  History reviewed. No pertinent past medical history.  There are no problems to display for this patient.   Past Surgical History:  Procedure Laterality Date  . NO PAST SURGERIES      OB History   No obstetric history on file.      Home Medications    Prior to Admission medications   Medication Sig Start Date End Date Taking? Authorizing Provider  cetirizine (ZYRTEC) 10 MG tablet Take 10 mg by mouth daily.   Yes [provider]  albuterol (VENTOLIN HFA) 108 (90 Base) MCG/ACT inhaler Inhale 2 puffs into the lungs every 6 (six) hours as needed for wheezing or shortness of breath. 10/31/19   Hassell Done, Mary-Margaret, FNP  promethazine (PHENERGAN) 12.5 MG tablet Take 1 tablet (12.5 mg total) by mouth every 6 (six) hours as needed for nausea or vomiting.  07/03/19   Drenda Freeze, MD    Family History Family History  Problem Relation Age of Onset  . Hypertension Mother   . Healthy Father     Social History Social History   Tobacco Use  . Smoking status: Never Smoker  . Smokeless tobacco: Never Used  Vaping Use  . Vaping Use: Never used  Substance Use Topics  . Alcohol use: Never  . Drug use: Never     Allergies   Codeine   Review of Systems Review of Systems as noted above in HPI.  Other systems reviewed and found to be negative.   Physical Exam Triage Vital Signs ED Triage Vitals  Enc Vitals Group     BP 11/20/19 1048 127/83     Pulse Rate 11/20/19 1048 76     Resp 11/20/19 1048 14     Temp 11/20/19 1048 98.5 F (36.9 C)     Temp Source 11/20/19 1048 Oral     SpO2 11/20/19 1048 100 %     Weight 11/20/19 1046 160 lb (72.6 kg)     Height 11/20/19 1046 6' (1.829 m)     Head Circumference --      Peak Flow --      Pain Score 11/20/19 1045 8     Pain Loc --      Pain Edu? --  Excl. in GC? --    No data found.  Updated Vital Signs BP 127/83 (BP Location: Right Arm)   Pulse 76   Temp 98.5 F (36.9 C) (Oral)   Resp 14   Ht 6' (1.829 m)   Wt 160 lb (72.6 kg)   LMP 11/12/2019 (Approximate)   SpO2 100%   BMI 21.70 kg/m   Physical Exam Exam conducted with a chaperone present.  Constitutional:      Appearance: Normal appearance.     Comments: Some speech stuttering which is baseline.  No slurring  Pulmonary:     Effort: Pulmonary effort is normal.     Breath sounds: Normal breath sounds.  Chest:    Neurological:     General: No focal deficit present.     Mental Status: She is alert and oriented to person, place, and time.      UC Treatments / Results  Labs (all labs ordered are listed, but only abnormal results are displayed) Labs Reviewed - No data to display  EKG   Radiology No results found.  Procedures Procedures (including critical care time)  Medications Ordered in  UC Medications - No data to display  Initial Impression / Assessment and Plan / UC Course  I have reviewed the triage vital signs and the nursing notes.  Pertinent labs & imaging results that were available during my care of the patient were reviewed by me and considered in my medical decision making (see chart for details).    Exam with tenderness to the muscles located in the arm area of the left armpit.  Most likely latissimus dorsi and/or teres major.  No painful or swollen lymph nodes noted.  Most likely related to use injury at work.  Patient works in a hotel.  Recommend ibuprofen Tylenol as needed for pain cold therapy for swelling.  Have her follow-up with orthopedics if no improvement. Final Clinical Impressions(s) / UC Diagnoses   Final diagnoses:  Left arm pain     Discharge Instructions     -Tenderness is to muscles that attach the arm to chest wall.  No lymph nodes noted.  Most likely related to a strain -Recommend ibuprofen and Tylenol for pain and swelling -Can do ice/cold therapy 20 minutes on 20 minutes off for pain -Rest arm as able -Return to clinic or follow-up with orthopedics if no improvement over next 7-10 days    ED Prescriptions    None     PDMP not reviewed this encounter.   Luvenia Redden, PA-C 11/20/19 1123   Addendum: As patient made really she asked about some numbness and tingling in the left hand as well.  Again this could be related to muscle pain and swelling and effect on the nerves she is removed through the armpit.  Again have her follow-up with orthopedics if not any better in 7 to 10 days.   Luvenia Redden, PA-C 11/20/19 1126

## 2019-11-20 NOTE — Discharge Instructions (Addendum)
-  Tenderness is to muscles that attach the arm to chest wall.  No lymph nodes noted.  Most likely related to a strain -Recommend ibuprofen and Tylenol for pain and swelling -Can do ice/cold therapy 20 minutes on 20 minutes off for pain -Rest arm as able -Return to clinic or follow-up with orthopedics if no improvement over next 7-10 days

## 2019-12-15 ENCOUNTER — Other Ambulatory Visit: Payer: Self-pay | Admitting: Physician Assistant

## 2019-12-15 DIAGNOSIS — Z1231 Encounter for screening mammogram for malignant neoplasm of breast: Secondary | ICD-10-CM

## 2020-06-20 NOTE — Progress Notes (Unsigned)
Due to Covid 19 pandemic a Televisit was used to enroll patient into our BCCCP program and obtain her health history.  Verbal consent obtained.  2 identifiers were used to confirm I was speaking to the correct patient.   She denies any breast problems at this time.  Last pap was in November at the [facility].  She is to present directly to the [facility] for her mammogram on Wednesday at 10:00.

## 2020-06-22 ENCOUNTER — Ambulatory Visit: Payer: Medicaid Other

## 2020-06-22 DIAGNOSIS — Z0000 Encounter for general adult medical examination without abnormal findings: Secondary | ICD-10-CM

## 2020-07-06 ENCOUNTER — Other Ambulatory Visit: Payer: Self-pay

## 2020-07-06 ENCOUNTER — Encounter: Payer: Self-pay | Admitting: *Deleted

## 2020-07-06 ENCOUNTER — Ambulatory Visit: Payer: Self-pay | Attending: Oncology | Admitting: *Deleted

## 2020-07-06 ENCOUNTER — Ambulatory Visit
Admission: RE | Admit: 2020-07-06 | Discharge: 2020-07-06 | Disposition: A | Discharge status: Home / Self Care | Payer: Self-pay | Source: Ambulatory Visit | Attending: Oncology | Admitting: Oncology

## 2020-07-06 DIAGNOSIS — Z0000 Encounter for general adult medical examination without abnormal findings: Secondary | ICD-10-CM | POA: Insufficient documentation

## 2020-07-06 NOTE — Progress Notes (Signed)
See notes from 06/20/20 televisit.  Patient was prescreened for BCCCP.  Consent and health history were taken at that time.  Patient rescheduled her mammogram for today.  She did present this morning to the [facility] for her screening mammogram.  Will follow up per BCCCP protocol.  Risk Assessment   No risk assessment data for the current encounter  Risk Scores      06/22/2020   Last edited by: Rico Junker, RN   5-year risk: 1 %   Lifetime risk: 17.3 %

## 2020-07-07 ENCOUNTER — Encounter: Payer: Self-pay | Admitting: *Deleted

## 2020-07-07 NOTE — Progress Notes (Signed)
Letter mailed from the [facility] to inform patient of her normal mammogram results.  Patient is to follow-up with annual screening in one year. 

## 2020-09-06 ENCOUNTER — Ambulatory Visit: Payer: Medicaid Other | Attending: Oncology

## 2020-09-06 ENCOUNTER — Encounter: Payer: Self-pay | Admitting: Family

## 2020-12-05 ENCOUNTER — Telehealth: Payer: Self-pay | Admitting: Family Medicine

## 2020-12-05 DIAGNOSIS — D229 Melanocytic nevi, unspecified: Secondary | ICD-10-CM

## 2020-12-05 NOTE — Progress Notes (Signed)
Pt needs face to face

## 2021-04-18 ENCOUNTER — Telehealth: Payer: Medicaid Other | Admitting: Physician Assistant

## 2021-04-18 DIAGNOSIS — M545 Low back pain, unspecified: Secondary | ICD-10-CM

## 2021-04-18 DIAGNOSIS — M549 Dorsalgia, unspecified: Secondary | ICD-10-CM

## 2021-04-18 MED ORDER — ETODOLAC 300 MG PO CAPS: 300.0000 mg | ORAL_CAPSULE | Freq: Two times a day (BID) | ORAL | 0 refills | Status: AC

## 2021-04-18 MED ORDER — CYCLOBENZAPRINE HCL 10 MG PO TABS: 10.0000 mg | ORAL_TABLET | Freq: Three times a day (TID) | ORAL | 0 refills | Status: DC | PRN

## 2021-04-18 NOTE — Progress Notes (Signed)
Based on what you shared with me, I feel your condition warrants further evaluation and I recommend that you be seen in a face to face visit.  I am concerned that you are having numbness and weakness in your legs associated with your pain. This could be a sign of a more severe problem with your nerves or spinal cord. You will need to be seen in person so that you can have a physical exam.   NOTE: There will be NO CHARGE for this eVisit   If you are having a true medical emergency please call 911.      For an urgent face to face visit, [place] has six urgent care centers for your convenience:     [facility] [place] [facility] [place] Get Driving Directions [phone] [place], [place] [address]    [facility]) Get Driving Directions [phone] [place], [place] [address]  [facility] [place] ([place]) Get Driving Directions [phone] [address] [place],  [place]  [address]  [facility] Get Driving Directions [phone] [place], [place] [place], [place] [address]   [facility] Get Driving Directions  [phone] [address] [place], [place] [address]   [facility] [place] Get Driving Directions [phone] [address], [place], [place] [address]  Your MyChart E-visit questionnaire answers were reviewed by a board certified advanced clinical practitioner to complete your personal care plan based on your specific symptoms.  Thank you for using e-Visits.   Approximately 5 minutes was spent documenting and reviewing patient's chart.

## 2021-04-18 NOTE — Progress Notes (Signed)

## 2021-05-29 ENCOUNTER — Ambulatory Visit: Admit: 2021-05-29 | Payer: Medicaid Other

## 2021-05-29 ENCOUNTER — Ambulatory Visit
Admission: EM | Admit: 2021-05-29 | Discharge: 2021-05-29 | Disposition: A | Discharge status: Home / Self Care | Payer: Medicaid Other | Attending: Emergency Medicine | Admitting: Emergency Medicine

## 2021-05-29 ENCOUNTER — Other Ambulatory Visit: Payer: Self-pay

## 2021-05-29 DIAGNOSIS — U071 COVID-19: Secondary | ICD-10-CM

## 2021-05-29 MED ORDER — IPRATROPIUM BROMIDE 0.06 % NA SOLN
2.0000 | Freq: Four times a day (QID) | NASAL | 12 refills | Status: DC
Start: 2021-05-29 — End: 2021-09-06

## 2021-05-29 MED ORDER — BENZONATATE 100 MG PO CAPS: 200.0000 mg | ORAL_CAPSULE | Freq: Three times a day (TID) | ORAL | 0 refills | Status: DC

## 2021-05-29 MED ORDER — MOLNUPIRAVIR EUA 200MG CAPSULE: 4.0000 | ORAL_CAPSULE | Freq: Two times a day (BID) | ORAL | 0 refills | Status: AC

## 2021-05-29 MED ORDER — PROMETHAZINE-PHENYLEPHRINE 6.25-5 MG/5ML PO SYRP: 5.0000 mL | ORAL_SOLUTION | Freq: Four times a day (QID) | ORAL | 0 refills | Status: DC | PRN

## 2021-05-29 NOTE — Discharge Instructions (Signed)
You will have to quarantine for 5 days from the start of your symptoms.  After 5 days you can break quarantine if your symptoms have improved and you have not had a fever for 24 hours without taking Tylenol or ibuprofen.  Use over-the-counter Tylenol and ibuprofen as needed for body aches and fever.  Use the Atrovent nasal spray, 2 squirts in each nostril every 6 hours, as needed for runny nose and postnasal drip.  Use the Tessalon Perles every 8 hours during the day.  Take them with a small sip of water.  They may give you some numbness to the base of your tongue or a metallic taste in your mouth, this is normal.  Use the Promethazine VC cough syrup at bedtime for cough and congestion.  It will make you drowsy so do not take it during the day.  If you develop any increased shortness of breath-especially at rest, you are unable to speak in full sentences, or is a late sign your lips are turning blue you need to go the ER for evaluation.

## 2021-05-29 NOTE — ED Provider Notes (Signed)
[facility]    CSN: [id number] Arrival date & time: 05/29/21  1131      History   Chief Complaint Chief Complaint  Patient presents with   Covid Positive    HPI Denise Thompson is a 40 y.o. female.   HPI  40 year old female here for evaluation of respiratory complaints.  Patient reports that she developed low-grade fever, runny nose and nasal congestion, sore throat, productive cough, and chills last night.  She tested positive for COVID this morning.  She has not had any ear pain, shortness breath or wheezing, or GI complaints.  History reviewed. No pertinent past medical history.  There are no problems to display for this patient.   Past Surgical History:  Procedure Laterality Date   NO PAST SURGERIES      OB History   No obstetric history on file.      Home Medications    Prior to Admission medications   Medication Sig Start Date End Date Taking? Authorizing Provider  benzonatate (TESSALON) 100 MG capsule Take 2 capsules (200 mg total) by mouth every 8 (eight) hours. 05/29/21  Yes Margarette Canada, NP  ipratropium (ATROVENT) 0.06 % nasal spray Place 2 sprays into both nostrils 4 (four) times daily. 05/29/21  Yes Margarette Canada, NP  molnupiravir EUA (LAGEVRIO) 200 mg CAPS capsule Take 4 capsules (800 mg total) by mouth 2 (two) times daily for 5 days. 05/29/21 06/03/21 Yes Margarette Canada, NP  promethazine-phenylephrine (PROMETHAZINE VC) 6.25-5 MG/5ML SYRP Take 5 mLs by mouth every 6 (six) hours as needed for congestion. 05/29/21  Yes Margarette Canada, NP    Family History Family History  Problem Relation Age of Onset   Hypertension Mother    Breast cancer Mother 74   Healthy Father     Social History Social History   Tobacco Use   Smoking status: Never   Smokeless tobacco: Never  Vaping Use   Vaping Use: Never used  Substance Use Topics   Alcohol use: Never   Drug use: Never     Allergies   Codeine   Review of Systems Review of Systems   Constitutional:  Positive for chills, fatigue and fever. Negative for activity change and appetite change.  HENT:  Positive for congestion, rhinorrhea and sore throat. Negative for ear pain.   Respiratory:  Positive for cough. Negative for shortness of breath and wheezing.   Gastrointestinal:  Negative for diarrhea, nausea and vomiting.  Skin:  Negative for rash.  Hematological: Negative.   Psychiatric/Behavioral: Negative.      Physical Exam Triage Vital Signs ED Triage Vitals  Enc Vitals Group     BP 05/29/21 1144 131/86     Pulse Rate 05/29/21 1144 (!) 110     Resp 05/29/21 1144 18     Temp 05/29/21 1144 100 F (37.8 C)     Temp Source 05/29/21 1144 Oral     SpO2 05/29/21 1144 100 %     Weight 05/29/21 1143 160 lb (72.6 kg)     Height 05/29/21 1143 6' (1.829 m)     Head Circumference --      Peak Flow --      Pain Score 05/29/21 1143 8     Pain Loc --      Pain Edu? --      Excl. in [place]? --    No data found.  Updated Vital Signs BP 131/86 (BP Location: Left Arm)    Pulse (!) 110  Temp 100 F (37.8 C) (Oral)    Resp 18    Ht 6' (1.829 m)    Wt 160 lb (72.6 kg)    LMP 05/15/2021    SpO2 100%    BMI 21.70 kg/m   Visual Acuity Right Eye Distance:   Left Eye Distance:   Bilateral Distance:    Right Eye Near:   Left Eye Near:    Bilateral Near:     Physical Exam Vitals and nursing note reviewed.  Constitutional:      General: She is not in acute distress.    Appearance: Normal appearance. She is not ill-appearing.  HENT:     Head: Normocephalic and atraumatic.     Right Ear: Tympanic membrane, ear canal and external ear normal. There is no impacted cerumen.     Left Ear: Tympanic membrane, ear canal and external ear normal. There is no impacted cerumen.     Nose: Congestion and rhinorrhea present.     Mouth/Throat:     Mouth: Mucous membranes are moist.     Pharynx: Oropharynx is clear. Posterior oropharyngeal erythema present.  Cardiovascular:     Rate and  Rhythm: Normal rate and regular rhythm.     Pulses: Normal pulses.     Heart sounds: Normal heart sounds. No murmur heard.   No gallop.  Pulmonary:     Effort: Pulmonary effort is normal.     Breath sounds: Normal breath sounds. No wheezing, rhonchi or rales.  Musculoskeletal:     Cervical back: Normal range of motion and neck supple.  Lymphadenopathy:     Cervical: No cervical adenopathy.  Skin:    General: Skin is warm and dry.     Capillary Refill: Capillary refill takes less than 2 seconds.     Findings: No erythema or rash.  Neurological:     General: No focal deficit present.     Mental Status: She is alert and oriented to person, place, and time.  Psychiatric:        Mood and Affect: Mood normal.        Behavior: Behavior normal.        Thought Content: Thought content normal.        Judgment: Judgment normal.     UC Treatments / Results  Labs (all labs ordered are listed, but only abnormal results are displayed) Labs Reviewed - No data to display  EKG   Radiology No results found.  Procedures Procedures (including critical care time)  Medications Ordered in UC Medications - No data to display  Initial Impression / Assessment and Plan / UC Course  I have reviewed the triage vital signs and the nursing notes.  Pertinent labs & imaging results that were available during my care of the patient were reviewed by me and considered in my medical decision making (see chart for details).  Patient is a nontoxic-appearing 40 year old female here for evaluation after testing positive for COVID this morning.  She reports that her symptoms developed last evening.  Patient is able to speak in full sentences and does not demonstrate any dyspnea or tachypnea.  Her physical exam reveals pearly gray tympanic membranes bilaterally with normal reflex and clear external auditory canals.  Nasal mucosa is mildly erythematous and edematous with clear nasal discharge.  Oropharyngeal exam  reveals mild posterior oropharyngeal erythema with clear postnasal drip.  No cervical adenopathy appreciated exam.  Cardiopulmonary exam reveals clear lung sounds in all fields.  Will discharge patient home on  molnupiravir for treatment of her COVID-19 and give her Tessalon Perles, Promethazine VC cough syrup, and Atrovent nasal spray to help with nasal congestion and cough.  Patient to use Tylenol and ibuprofen as needed for fever and body aches.  ER and return precautions reviewed with patient.  I have also given the patient a work note to cover her for the 5-day quarantine duration.   Final Clinical Impressions(s) / UC Diagnoses   Final diagnoses:  [id number]     Discharge Instructions      You will have to quarantine for 5 days from the start of your symptoms.  After 5 days you can break quarantine if your symptoms have improved and you have not had a fever for 24 hours without taking Tylenol or ibuprofen.  Use over-the-counter Tylenol and ibuprofen as needed for body aches and fever.  Use the Atrovent nasal spray, 2 squirts in each nostril every 6 hours, as needed for runny nose and postnasal drip.  Use the Tessalon Perles every 8 hours during the day.  Take them with a small sip of water.  They may give you some numbness to the base of your tongue or a metallic taste in your mouth, this is normal.  Use the Promethazine VC cough syrup at bedtime for cough and congestion.  It will make you drowsy so do not take it during the day.  If you develop any increased shortness of breath-especially at rest, you are unable to speak in full sentences, or is a late sign your lips are turning blue you need to go the ER for evaluation.      ED Prescriptions     Medication Sig Dispense Auth. Provider   molnupiravir EUA (LAGEVRIO) 200 mg CAPS capsule Take 4 capsules (800 mg total) by mouth 2 (two) times daily for 5 days. 40 capsule Margarette Canada, NP   benzonatate (TESSALON) 100 MG capsule Take 2  capsules (200 mg total) by mouth every 8 (eight) hours. 21 capsule Margarette Canada, NP   ipratropium (ATROVENT) 0.06 % nasal spray Place 2 sprays into both nostrils 4 (four) times daily. 15 mL Margarette Canada, NP   promethazine-phenylephrine (PROMETHAZINE VC) 6.25-5 MG/5ML SYRP Take 5 mLs by mouth every 6 (six) hours as needed for congestion. 180 mL Margarette Canada, NP      PDMP not reviewed this encounter.   Margarette Canada, NP 05/29/21 [phone]

## 2021-05-29 NOTE — ED Triage Notes (Signed)
Pt here with C/O testing positive for Covid this morning, pt SX are low grade fever, sore throat, cough, chills.

## 2021-06-21 ENCOUNTER — Ambulatory Visit: Payer: Medicaid Other

## 2021-06-21 IMAGING — MG MM DIGITAL SCREENING BILAT W/ TOMO AND CAD
8 series · 8 of 24 positions shown · non-contrast
Comparison: None.

CLINICAL DATA: Screening.

EXAM:
DIGITAL SCREENING BILATERAL MAMMOGRAM WITH TOMO AND CAD

[L CC synth-2D]
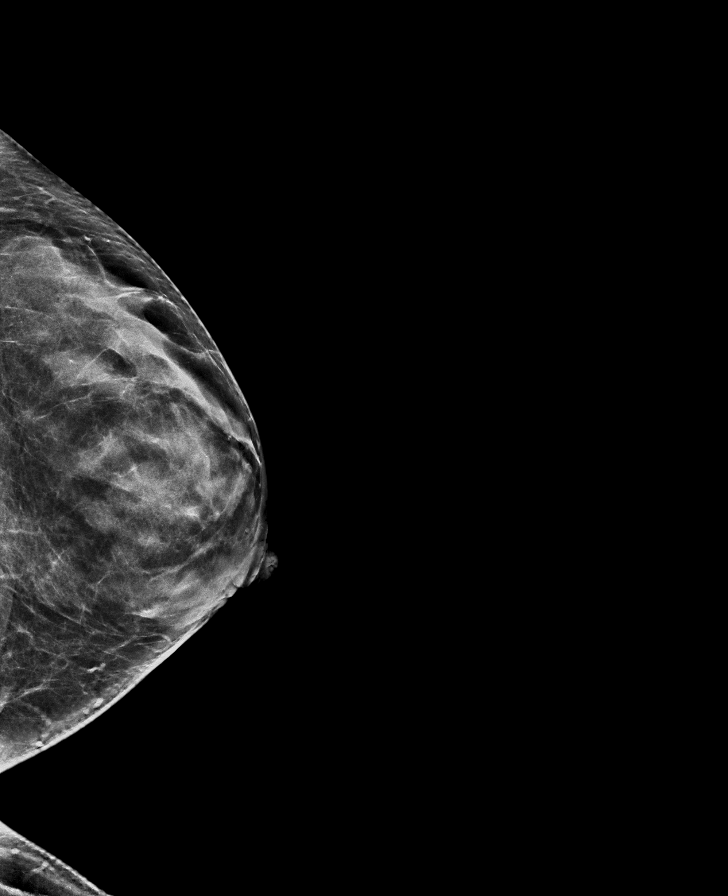

[R CC synth-2D]
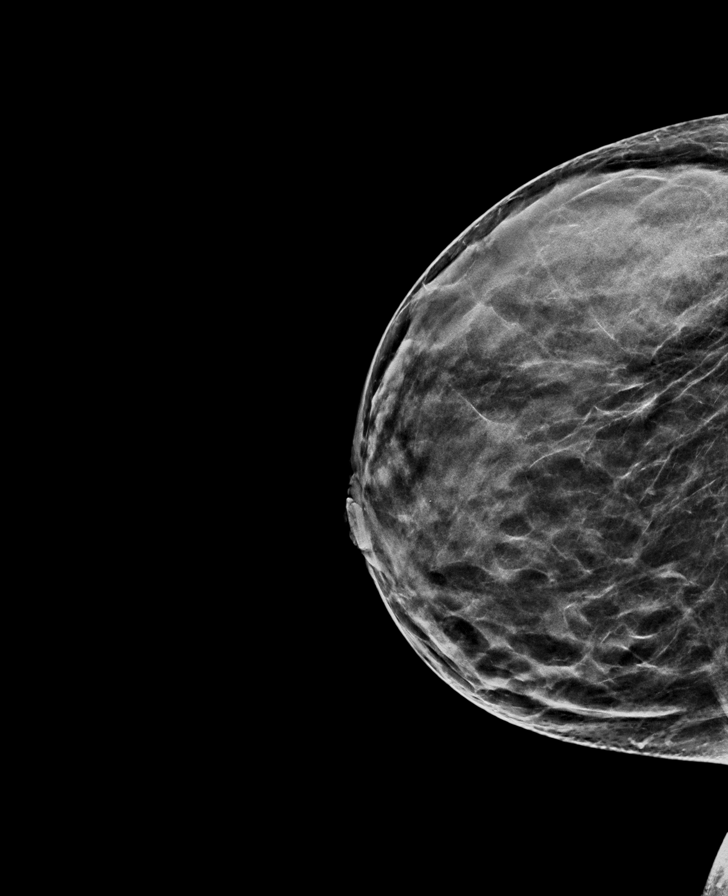

[R MLO synth-2D]
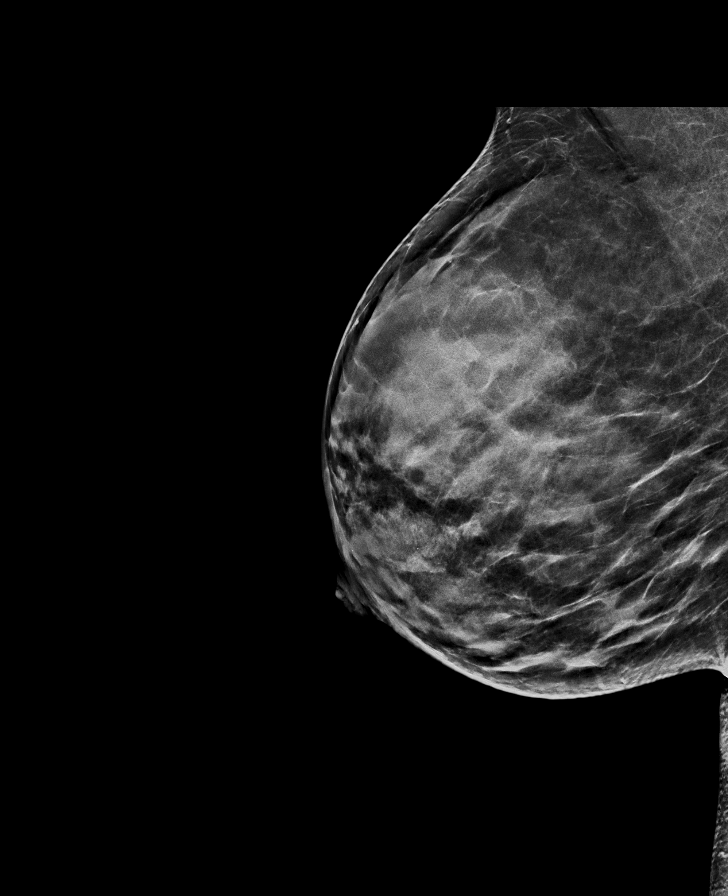

[L MLO synth-2D]
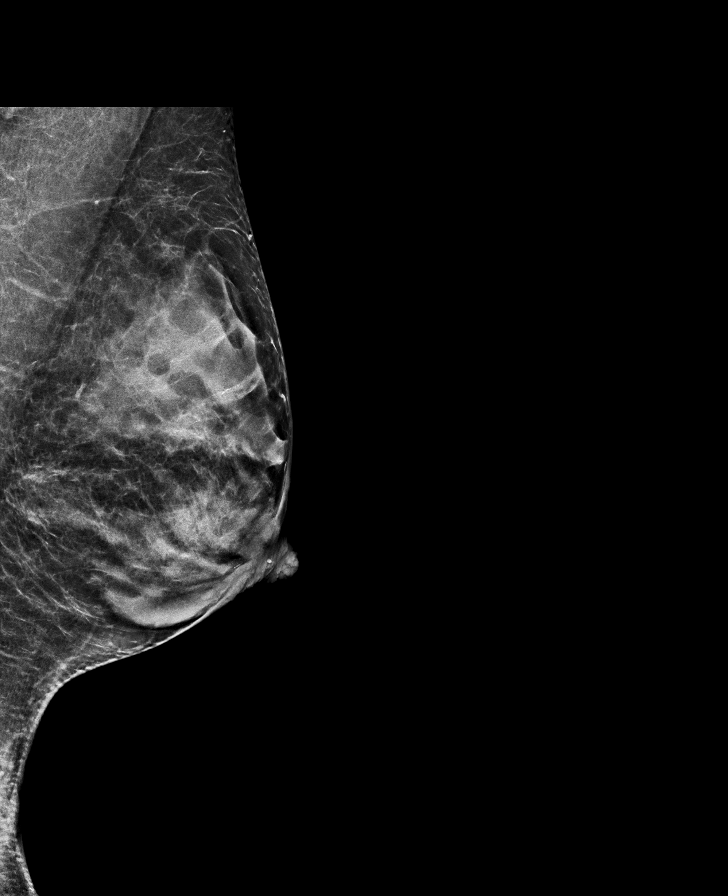

[L CC tomo · tomo slice 31/62.0]
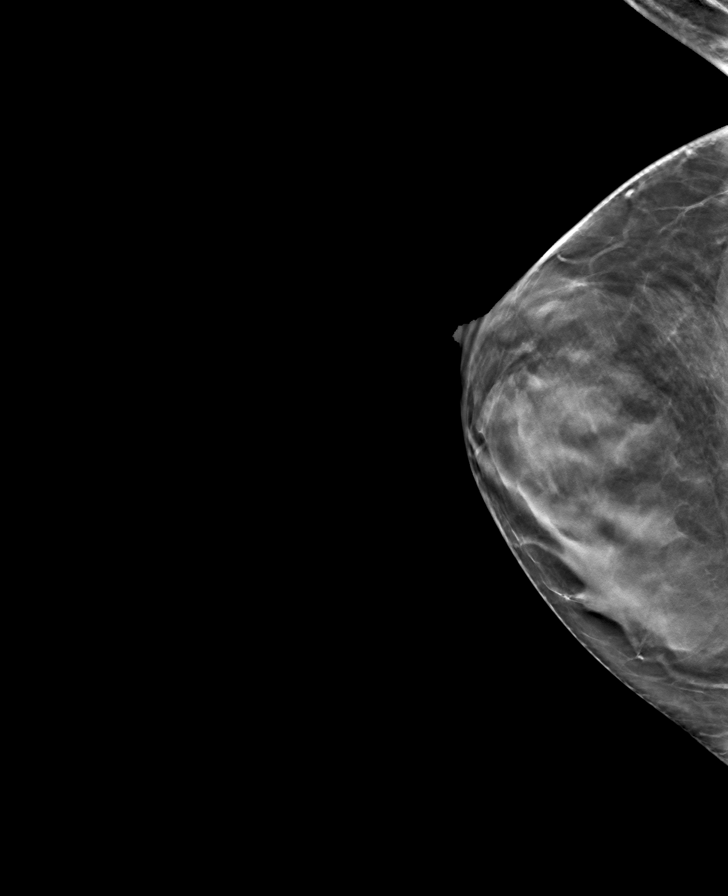

[R CC tomo · tomo slice 31/60.0]
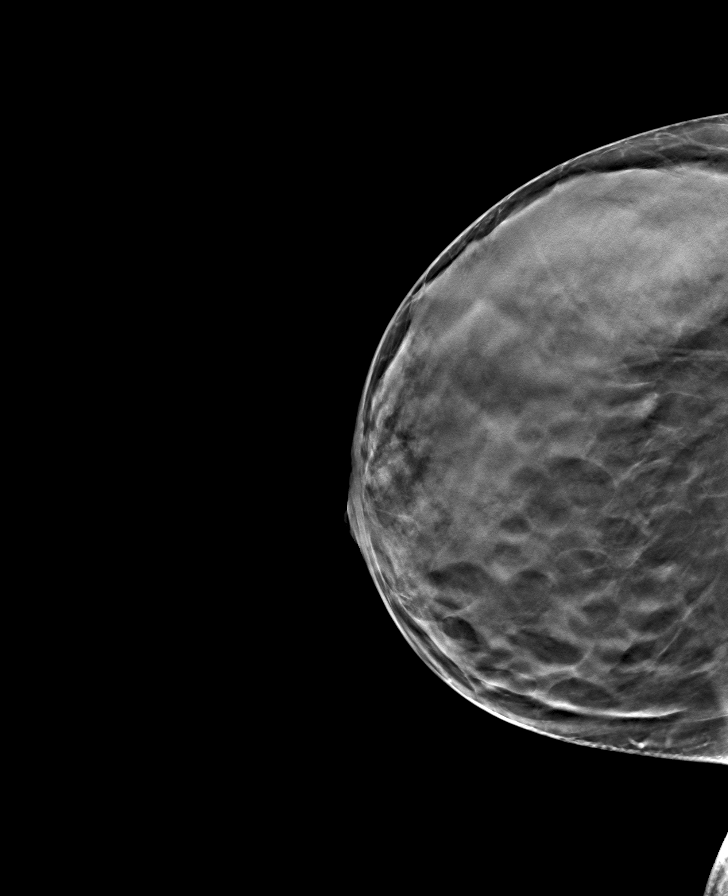

[L MLO tomo · tomo slice 30/59.0]
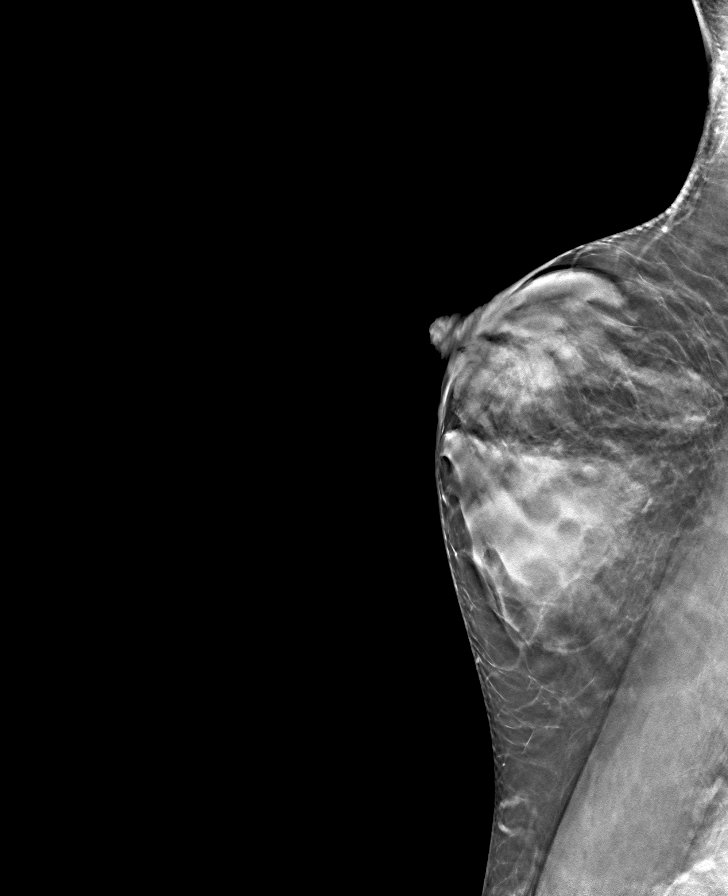

[R MLO tomo · tomo slice 31/60.0]
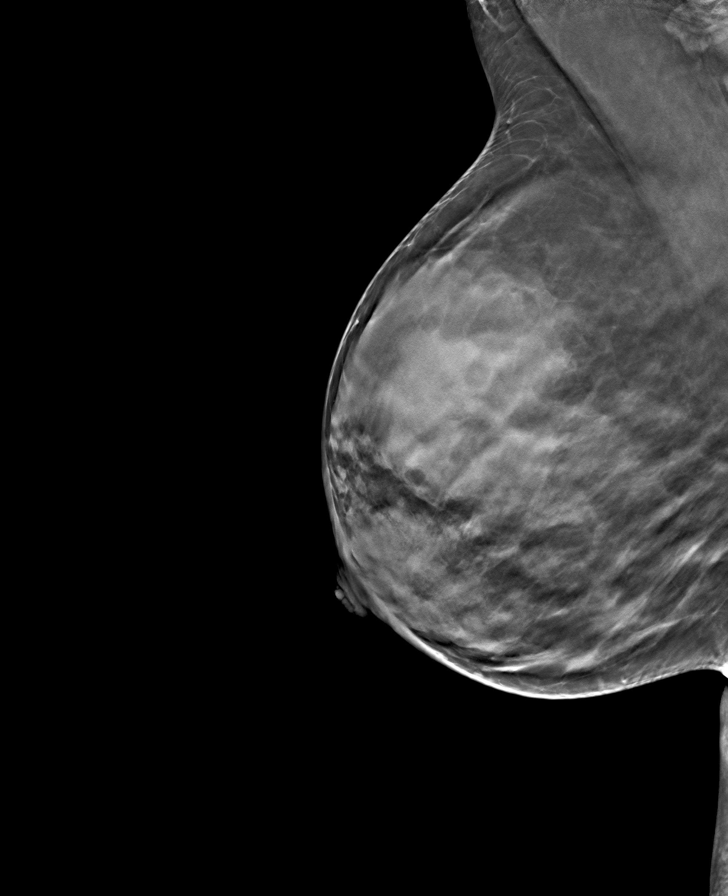

[8 of 24 positions shown; findings below may reference images not displayed]

ACR Breast Density Category d: The breast tissue is extremely dense,
which lowers the sensitivity of mammography.
FINDINGS: There are no findings suspicious for malignancy. The images were
evaluated with computer-aided detection.
IMPRESSION: No mammographic evidence of malignancy. A result letter of this
screening mammogram will be mailed directly to the patient.

RECOMMENDATION:
Screening mammogram in one year. (Code:D6-V-5A3)

BI-RADS CATEGORY  1: Negative.

## 2021-09-04 ENCOUNTER — Other Ambulatory Visit: Payer: Self-pay

## 2021-09-04 DIAGNOSIS — Z1231 Encounter for screening mammogram for malignant neoplasm of breast: Secondary | ICD-10-CM

## 2021-09-06 ENCOUNTER — Ambulatory Visit: Payer: Medicaid Other | Attending: Hematology and Oncology | Admitting: *Deleted

## 2021-09-06 ENCOUNTER — Ambulatory Visit
Admission: RE | Admit: 2021-09-06 | Discharge: 2021-09-06 | Disposition: A | Discharge status: Home / Self Care | Payer: Self-pay | Source: Ambulatory Visit | Attending: Obstetrics and Gynecology | Admitting: Obstetrics and Gynecology

## 2021-09-06 VITALS — BP 124/70 | Temp 98.3°F | Resp 18 | Wt 172.0 lb

## 2021-09-06 DIAGNOSIS — Z1231 Encounter for screening mammogram for malignant neoplasm of breast: Secondary | ICD-10-CM | POA: Insufficient documentation

## 2021-09-06 DIAGNOSIS — Z1239 Encounter for other screening for malignant neoplasm of breast: Secondary | ICD-10-CM

## 2021-09-06 NOTE — Patient Instructions (Signed)
Explained breast self awareness with Thersa Salt. Patient did not need a Pap smear today due to last Pap smear was 04/22/2020. Let her know B[facility]CP will cover Pap smears every 3 years unless has a history of abnormal Pap smears. Referred patient to the [facility] for a screening mammogram. Appointment scheduled Wednesday, September 06, 2021 at 1340. Patient aware of appointment and will be there. Let patient know Hartford Poli will follow up with her within the next couple weeks with results of her mammogram by letter or phone. Royston Sinner Barcenas verbalized understanding. ? ?Emauri Krygier, Arvil Chaco, RN ?2:08 PM ? ? ? ? ?

## 2021-09-06 NOTE — Progress Notes (Signed)
[name] is a 41 y.o. female who presents to [facility] clinic today with no complaints.  ?  ?Pap Smear: Pap smear not completed today. Last Pap smear was 04/22/2020 at [facility] clinic and was normal. Per patient has no history of an abnormal Pap smear. Last Pap smear result is available in Epic. ?  ?Physical exam: ?Breasts ?Right breast larger than left breast that per patient is normal for her. No skin abnormalities bilateral breasts. No nipple retraction bilateral breasts. No nipple discharge bilateral breasts. No lymphadenopathy. No lumps palpated bilateral breasts. No complaints of pain or tenderness on exam. ? ?MS DIGITAL SCREENING TOMO BILATERAL ? ?Result Date: 07/06/2020 ?CLINICAL DATA:  Screening. EXAM: DIGITAL SCREENING BILATERAL MAMMOGRAM WITH TOMO AND CAD COMPARISON:  None. ACR Breast Density Category d: The breast tissue is extremely dense, which lowers the sensitivity of mammography. FINDINGS: There are no findings suspicious for malignancy. The images were evaluated with computer-aided detection. IMPRESSION: No mammographic evidence of malignancy. A result letter of this screening mammogram will be mailed directly to the patient. RECOMMENDATION: Screening mammogram in one year. (Code:SM-B-01Y) BI-RADS CATEGORY  1: Negative. Electronically Signed   By: Zerita Boers M.D.   On: 07/06/2020 15:43   ?      ?Pelvic/Bimanual ?Pap is not indicated today per BCCCP guidelines. ?  ?Smoking History: ?Patient has never smoked. ?  ?Patient Navigation: ?Patient education provided. Access to services provided for patient through [facility] program.  ? ?Colorectal Cancer Screening: ?Per patient has never had colonoscopy completed. No complaints today.  ?  ?Breast and Cervical Cancer Risk Assessment: ?Patient has family history of her mother having breast cancer. Patient has no known genetic mutations or history of radiation treatment to the chest before age 41. Patient does not have history of cervical  dysplasia, immunocompromised, or DES exposure in-utero. ? ?Risk Assessment   ? ? Risk Scores   ? ?   09/06/2021 06/22/2020  ? Last edited by: Drue Dun, RN Rico Junker, RN  ? 5-year risk: 1.1 % 1 %  ? Lifetime risk: 17.1 % 17.3 %  ? ?  ?  ? ?  ? ? ?A: ?BCCCP exam without pap smear ?No complaints. ? ?P: ?Referred patient to the [facility] for a screening mammogram. Appointment scheduled Wednesday, September 06, 2021 at 1340. ? ?Loletta Parish, RN ?09/06/2021 2:09 PM   ?

## 2022-04-03 ENCOUNTER — Ambulatory Visit
Admission: RE | Admit: 2022-04-03 | Discharge: 2022-04-03 | Disposition: A | Discharge status: Home / Self Care | Payer: Medicaid Other | Source: Ambulatory Visit | Attending: Emergency Medicine | Admitting: Emergency Medicine

## 2022-04-03 ENCOUNTER — Ambulatory Visit: Payer: Medicaid Other

## 2022-04-03 VITALS — BP 132/82 | HR 88 | Temp 98.2°F | Resp 18 | Ht 72.0 in | Wt 160.0 lb

## 2022-04-03 DIAGNOSIS — S8990XA Unspecified injury of unspecified lower leg, initial encounter: Secondary | ICD-10-CM | POA: Insufficient documentation

## 2022-04-03 DIAGNOSIS — S80212A Abrasion, left knee, initial encounter: Secondary | ICD-10-CM

## 2022-04-03 DIAGNOSIS — S8002XA Contusion of left knee, initial encounter: Secondary | ICD-10-CM

## 2022-04-03 DIAGNOSIS — S8992XA Unspecified injury of left lower leg, initial encounter: Secondary | ICD-10-CM

## 2022-04-03 MED ORDER — TETANUS-DIPHTH-ACELL PERTUSSIS 5-2.5-18.5 LF-MCG/0.5 IM SUSY: 0.5000 mL | PREFILLED_SYRINGE | Freq: Once | INTRAMUSCULAR | Status: DC

## 2022-04-03 NOTE — ED Triage Notes (Signed)
Patient to Urgent Care with complaints of left sided knee pain, reports falling today on concrete and injuring her knee. Denies any previous injury.  Mechanical ground level fall, denies hitting head or LOC.

## 2022-04-03 NOTE — Discharge Instructions (Addendum)
  Keep your wound clean and dry.  Wash it gently twice a day with soap and water.  Apply an antibiotic cream twice a day.    Follow up if you see signs of infection, such as increased pain, redness, pus-like drainage, warmth, fever, chills, or other concerning symptoms.

## 2022-04-03 NOTE — ED Provider Notes (Signed)
[facility]    CSN: [id number] Arrival date & time: 04/03/22  1346      History   Chief Complaint Chief Complaint  Patient presents with   Knee Injury    I fell on concrete and have an abrasion and injury to my knee. - Entered by patient    HPI Denise Thompson is a 41 y.o. female.  Patient presents with left knee abrasion, pain, bruising after she fell today.  She tripped and fell today, landing on concrete.  Treatment with bandage.  She denies numbness, weakness, paresthesias, fever, chills, wound drainage, or other symptoms.  Patient reports last tetanus 1 year ago.   The history is provided by the patient and medical records.    History reviewed. No pertinent past medical history.  Patient Active Problem List   Diagnosis Date Noted   Knee injury 04/03/2022    Past Surgical History:  Procedure Laterality Date   NO PAST SURGERIES      OB History   No obstetric history on file.      Home Medications    Prior to Admission medications   Not on File    Family History Family History  Problem Relation Age of Onset   Hypertension Mother    Breast cancer Mother 43   Healthy Father     Social History Social History   Tobacco Use   Smoking status: Never   Smokeless tobacco: Never  Vaping Use   Vaping Use: Never used  Substance Use Topics   Alcohol use: Never   Drug use: Never     Allergies   Codeine   Review of Systems Review of Systems  Constitutional:  Negative for chills and fever.  Musculoskeletal:  Positive for arthralgias and joint swelling. Negative for gait problem.  Skin:  Positive for color change and wound.  Neurological:  Negative for weakness and numbness.  All other systems reviewed and are negative.    Physical Exam Triage Vital Signs ED Triage Vitals  Enc Vitals Group     BP 04/03/22 1357 132/82     Pulse Rate 04/03/22 1354 88     Resp 04/03/22 1354 18     Temp 04/03/22 1354 98.2 F (36.8 C)     Temp src  --      SpO2 04/03/22 1354 98 %     Weight 04/03/22 1356 160 lb (72.6 kg)     Height 04/03/22 1356 6' (1.829 m)     Head Circumference --      Peak Flow --      Pain Score 04/03/22 1356 3     Pain Loc --      Pain Edu? --      Excl. in [place]? --    No data found.  Updated Vital Signs BP 132/82   Pulse 88   Temp 98.2 F (36.8 C)   Resp 18   Ht 6' (1.829 m)   Wt 160 lb (72.6 kg)   LMP 03/26/2022   SpO2 98%   BMI 21.70 kg/m   Visual Acuity Right Eye Distance:   Left Eye Distance:   Bilateral Distance:    Right Eye Near:   Left Eye Near:    Bilateral Near:     Physical Exam Vitals and nursing note reviewed.  Constitutional:      General: She is not in acute distress.    Appearance: Normal appearance. She is well-developed. She is not ill-appearing.  HENT:  Mouth/Throat:     Mouth: Mucous membranes are moist.  Cardiovascular:     Rate and Rhythm: Normal rate and regular rhythm.     Heart sounds: Normal heart sounds.  Pulmonary:     Effort: Pulmonary effort is normal. No respiratory distress.     Breath sounds: Normal breath sounds.  Musculoskeletal:        General: Tenderness present. No swelling or deformity. Normal range of motion.     Cervical back: Neck supple.  Skin:    General: Skin is warm and dry.     Capillary Refill: Capillary refill takes less than 2 seconds.     Findings: Bruising and lesion present.     Comments: Abrasion on left knee with surrounding ecchymosis and mild edema.   Neurological:     General: No focal deficit present.     Mental Status: She is alert and oriented to person, place, and time.     Sensory: No sensory deficit.     Motor: No weakness.     Gait: Gait normal.  Psychiatric:        Mood and Affect: Mood normal.        Behavior: Behavior normal.      UC Treatments / Results  Labs (all labs ordered are listed, but only abnormal results are displayed) Labs Reviewed - No data to display  EKG   Radiology No results  found.  Procedures Procedures (including critical care time)  Medications Ordered in UC Medications - No data to display   Initial Impression / Assessment and Plan / UC Course  I have reviewed the triage vital signs and the nursing notes.  Pertinent labs & imaging results that were available during my care of the patient were reviewed by me and considered in my medical decision making (see chart for details).    Abrasion and contusion of left knee due to injury.  Patient declines xray.  She reports her tetanus is up-to-date.  The abrasion is superficial.  Instructed patient to apply topical antibiotic to the wound twice a day.  Wound care instructions and signs of infection discussed.  Instructed patient to follow-up right away if she notes signs of infection.  Discussed rest, elevation, ice packs.  Work note provided.  Patient agrees to plan of care.  Final Clinical Impressions(s) / UC Diagnoses   Final diagnoses:  Injury of left knee, initial encounter  Abrasion of left knee, initial encounter  Contusion of left knee, initial encounter     Discharge Instructions       Keep your wound clean and dry.  Wash it gently twice a day with soap and water.  Apply an antibiotic cream twice a day.    Follow up if you see signs of infection, such as increased pain, redness, pus-like drainage, warmth, fever, chills, or other concerning symptoms.        ED Prescriptions   None    I have reviewed the PDMP during this encounter.   Sharion Balloon, NP 04/03/22 1435

## 2022-07-20 ENCOUNTER — Telehealth: Payer: Self-pay

## 2022-07-20 NOTE — Telephone Encounter (Signed)
Mychart msg sent. AS, CMA

## 2022-08-14 ENCOUNTER — Other Ambulatory Visit: Payer: Self-pay | Admitting: *Deleted

## 2022-08-14 DIAGNOSIS — Z1231 Encounter for screening mammogram for malignant neoplasm of breast: Secondary | ICD-10-CM

## 2022-09-24 ENCOUNTER — Other Ambulatory Visit: Payer: Self-pay | Admitting: Physician Assistant

## 2022-09-24 DIAGNOSIS — Z1231 Encounter for screening mammogram for malignant neoplasm of breast: Secondary | ICD-10-CM

## 2022-10-05 DIAGNOSIS — Z1231 Encounter for screening mammogram for malignant neoplasm of breast: Secondary | ICD-10-CM

## 2022-10-16 DIAGNOSIS — Z1389 Encounter for screening for other disorder: Secondary | ICD-10-CM | POA: Diagnosis not present

## 2022-10-16 DIAGNOSIS — E538 Deficiency of other specified B group vitamins: Secondary | ICD-10-CM | POA: Diagnosis not present

## 2022-10-16 DIAGNOSIS — Z1331 Encounter for screening for depression: Secondary | ICD-10-CM | POA: Diagnosis not present

## 2022-10-16 DIAGNOSIS — Z0000 Encounter for general adult medical examination without abnormal findings: Secondary | ICD-10-CM | POA: Diagnosis not present

## 2022-10-16 DIAGNOSIS — E611 Iron deficiency: Secondary | ICD-10-CM | POA: Diagnosis not present

## 2022-10-16 DIAGNOSIS — Z131 Encounter for screening for diabetes mellitus: Secondary | ICD-10-CM | POA: Diagnosis not present

## 2022-10-16 DIAGNOSIS — E559 Vitamin D deficiency, unspecified: Secondary | ICD-10-CM | POA: Diagnosis not present

## 2022-11-09 ENCOUNTER — Ambulatory Visit: Payer: Medicaid Other

## 2023-03-08 ENCOUNTER — Ambulatory Visit
Admission: RE | Admit: 2023-03-08 | Discharge: 2023-03-08 | Disposition: A | Discharge status: Home / Self Care | Payer: Medicaid Other | Source: Ambulatory Visit | Attending: Physician Assistant | Admitting: Physician Assistant

## 2023-03-08 DIAGNOSIS — Z1231 Encounter for screening mammogram for malignant neoplasm of breast: Secondary | ICD-10-CM | POA: Diagnosis not present

## 2023-08-23 ENCOUNTER — Ambulatory Visit

## 2024-02-17 ENCOUNTER — Other Ambulatory Visit: Payer: Self-pay | Admitting: Physician Assistant

## 2024-02-17 DIAGNOSIS — Z1231 Encounter for screening mammogram for malignant neoplasm of breast: Secondary | ICD-10-CM

## 2024-03-13 ENCOUNTER — Ambulatory Visit
Admission: RE | Admit: 2024-03-13 | Discharge: 2024-03-13 | Disposition: A | Discharge status: Home / Self Care | Source: Ambulatory Visit | Attending: Physician Assistant | Admitting: Physician Assistant

## 2024-03-13 DIAGNOSIS — Z1231 Encounter for screening mammogram for malignant neoplasm of breast: Secondary | ICD-10-CM | POA: Diagnosis not present

## 2024-05-30 ENCOUNTER — Telehealth: Admitting: Nurse Practitioner

## 2024-05-30 DIAGNOSIS — R309 Painful micturition, unspecified: Secondary | ICD-10-CM

## 2024-05-30 MED ORDER — NITROFURANTOIN MONOHYD MACRO 100 MG PO CAPS: 100.0000 mg | ORAL_CAPSULE | Freq: Two times a day (BID) | ORAL | 0 refills | Status: AC

## 2024-05-30 NOTE — Progress Notes (Signed)
